# Patient Record
Sex: Female | Born: 1960 | Hispanic: Yes | Marital: Married | Smoking: Never smoker
Health system: Southern US, Community
[De-identification: ages and names within clinical notes are randomized; demographics above are authoritative.]

## PROBLEM LIST (undated history)

## (undated) DIAGNOSIS — Z87898 Personal history of other specified conditions: Secondary | ICD-10-CM

## (undated) DIAGNOSIS — E785 Hyperlipidemia, unspecified: Secondary | ICD-10-CM

## (undated) DIAGNOSIS — S82899A Other fracture of unspecified lower leg, initial encounter for closed fracture: Secondary | ICD-10-CM

## (undated) DIAGNOSIS — E039 Hypothyroidism, unspecified: Secondary | ICD-10-CM

## (undated) DIAGNOSIS — J40 Bronchitis, not specified as acute or chronic: Secondary | ICD-10-CM

## (undated) DIAGNOSIS — F419 Anxiety disorder, unspecified: Secondary | ICD-10-CM

## (undated) HISTORY — DX: Hypothyroidism, unspecified: E03.9

## (undated) HISTORY — DX: Hyperlipidemia, unspecified: E78.5

## (undated) HISTORY — DX: Personal history of other specified conditions: Z87.898

## (undated) HISTORY — PX: MENISCUS REPAIR: SHX5179

## (undated) HISTORY — PX: TUBAL LIGATION: SHX77

---

## 1998-01-30 HISTORY — PX: CHOLECYSTECTOMY: SHX55

## 2010-05-12 ENCOUNTER — Other Ambulatory Visit (HOSPITAL_COMMUNITY): Payer: Self-pay | Admitting: Family Medicine

## 2010-05-12 DIAGNOSIS — Z139 Encounter for screening, unspecified: Secondary | ICD-10-CM

## 2010-05-19 ENCOUNTER — Ambulatory Visit (HOSPITAL_COMMUNITY)
Admission: RE | Admit: 2010-05-19 | Discharge: 2010-05-19 | Disposition: A | Discharge status: Home / Self Care | Payer: PRIVATE HEALTH INSURANCE | Source: Ambulatory Visit | Attending: Family Medicine | Admitting: Family Medicine

## 2010-05-19 DIAGNOSIS — Z139 Encounter for screening, unspecified: Secondary | ICD-10-CM

## 2011-09-01 ENCOUNTER — Other Ambulatory Visit (HOSPITAL_COMMUNITY): Payer: Self-pay | Admitting: Nurse Practitioner

## 2011-09-01 DIAGNOSIS — Z139 Encounter for screening, unspecified: Secondary | ICD-10-CM

## 2011-09-07 ENCOUNTER — Ambulatory Visit (HOSPITAL_COMMUNITY)
Admission: RE | Admit: 2011-09-07 | Discharge: 2011-09-07 | Disposition: A | Discharge status: Home / Self Care | Payer: Self-pay | Source: Ambulatory Visit | Attending: Nurse Practitioner | Admitting: Nurse Practitioner

## 2011-09-07 DIAGNOSIS — Z139 Encounter for screening, unspecified: Secondary | ICD-10-CM

## 2012-12-17 ENCOUNTER — Other Ambulatory Visit (HOSPITAL_COMMUNITY): Payer: Self-pay | Admitting: Physician Assistant

## 2012-12-17 DIAGNOSIS — Z139 Encounter for screening, unspecified: Secondary | ICD-10-CM

## 2013-01-03 ENCOUNTER — Ambulatory Visit (HOSPITAL_COMMUNITY)
Admission: RE | Admit: 2013-01-03 | Discharge: 2013-01-03 | Disposition: A | Discharge status: Home / Self Care | Payer: PRIVATE HEALTH INSURANCE | Source: Ambulatory Visit | Attending: Physician Assistant | Admitting: Physician Assistant

## 2013-01-03 ENCOUNTER — Other Ambulatory Visit (HOSPITAL_COMMUNITY): Payer: Self-pay | Admitting: Physician Assistant

## 2013-01-03 ENCOUNTER — Ambulatory Visit (HOSPITAL_COMMUNITY)
Admission: RE | Admit: 2013-01-03 | Discharge: 2013-01-03 | Disposition: A | Discharge status: Home / Self Care | Payer: Self-pay | Source: Ambulatory Visit | Attending: Physician Assistant | Admitting: Physician Assistant

## 2013-01-03 DIAGNOSIS — Z139 Encounter for screening, unspecified: Secondary | ICD-10-CM

## 2014-01-30 DIAGNOSIS — S82899A Other fracture of unspecified lower leg, initial encounter for closed fracture: Secondary | ICD-10-CM

## 2014-01-30 HISTORY — DX: Other fracture of unspecified lower leg, initial encounter for closed fracture: S82.899A

## 2014-02-17 ENCOUNTER — Ambulatory Visit (HOSPITAL_COMMUNITY)
Admission: RE | Admit: 2014-02-17 | Discharge: 2014-02-17 | Disposition: A | Discharge status: Home / Self Care | Payer: Self-pay | Source: Ambulatory Visit | Attending: Physician Assistant | Admitting: Physician Assistant

## 2014-02-17 ENCOUNTER — Other Ambulatory Visit (HOSPITAL_COMMUNITY): Payer: Self-pay | Admitting: Physician Assistant

## 2014-02-17 DIAGNOSIS — R609 Edema, unspecified: Secondary | ICD-10-CM | POA: Insufficient documentation

## 2014-02-17 DIAGNOSIS — R52 Pain, unspecified: Secondary | ICD-10-CM

## 2014-02-17 DIAGNOSIS — M25571 Pain in right ankle and joints of right foot: Secondary | ICD-10-CM | POA: Insufficient documentation

## 2014-03-04 ENCOUNTER — Ambulatory Visit: Payer: Self-pay | Admitting: Sports Medicine

## 2014-08-06 ENCOUNTER — Other Ambulatory Visit (HOSPITAL_COMMUNITY): Payer: Self-pay | Admitting: *Deleted

## 2014-08-06 DIAGNOSIS — Z1231 Encounter for screening mammogram for malignant neoplasm of breast: Secondary | ICD-10-CM

## 2014-08-19 ENCOUNTER — Ambulatory Visit (HOSPITAL_COMMUNITY)
Admission: RE | Admit: 2014-08-19 | Discharge: 2014-08-19 | Disposition: A | Discharge status: Home / Self Care | Payer: PRIVATE HEALTH INSURANCE | Source: Ambulatory Visit | Attending: *Deleted | Admitting: *Deleted

## 2014-08-19 DIAGNOSIS — Z1231 Encounter for screening mammogram for malignant neoplasm of breast: Secondary | ICD-10-CM | POA: Diagnosis not present

## 2015-04-01 ENCOUNTER — Other Ambulatory Visit (HOSPITAL_COMMUNITY): Payer: Self-pay | Admitting: *Deleted

## 2015-04-01 DIAGNOSIS — R109 Unspecified abdominal pain: Secondary | ICD-10-CM

## 2015-04-05 ENCOUNTER — Ambulatory Visit (HOSPITAL_COMMUNITY)
Admission: RE | Admit: 2015-04-05 | Discharge: 2015-04-05 | Disposition: A | Discharge status: Home / Self Care | Payer: Self-pay | Source: Ambulatory Visit | Attending: *Deleted | Admitting: *Deleted

## 2015-04-12 ENCOUNTER — Ambulatory Visit (HOSPITAL_COMMUNITY)
Admission: RE | Admit: 2015-04-12 | Discharge: 2015-04-12 | Disposition: A | Discharge status: Home / Self Care | Payer: Self-pay | Source: Ambulatory Visit | Attending: *Deleted | Admitting: *Deleted

## 2015-04-12 ENCOUNTER — Ambulatory Visit (HOSPITAL_COMMUNITY): Admission: RE | Admit: 2015-04-12 | Payer: Self-pay | Source: Ambulatory Visit

## 2015-04-12 DIAGNOSIS — R109 Unspecified abdominal pain: Secondary | ICD-10-CM | POA: Insufficient documentation

## 2015-04-12 DIAGNOSIS — R93421 Abnormal radiologic findings on diagnostic imaging of right kidney: Secondary | ICD-10-CM | POA: Insufficient documentation

## 2015-04-12 DIAGNOSIS — R932 Abnormal findings on diagnostic imaging of liver and biliary tract: Secondary | ICD-10-CM | POA: Insufficient documentation

## 2015-04-12 DIAGNOSIS — R93422 Abnormal radiologic findings on diagnostic imaging of left kidney: Secondary | ICD-10-CM | POA: Insufficient documentation

## 2015-07-15 ENCOUNTER — Encounter: Payer: Self-pay | Admitting: Internal Medicine

## 2015-08-20 ENCOUNTER — Other Ambulatory Visit: Payer: Self-pay

## 2015-08-20 ENCOUNTER — Encounter (INDEPENDENT_AMBULATORY_CARE_PROVIDER_SITE_OTHER): Payer: Self-pay

## 2015-08-20 ENCOUNTER — Ambulatory Visit (INDEPENDENT_AMBULATORY_CARE_PROVIDER_SITE_OTHER): Payer: PRIVATE HEALTH INSURANCE | Admitting: Gastroenterology

## 2015-08-20 ENCOUNTER — Encounter: Payer: Self-pay | Admitting: Gastroenterology

## 2015-08-20 VITALS — BP 114/78 | HR 58 | Temp 97.1°F | Ht 62.0 in | Wt 223.0 lb

## 2015-08-20 DIAGNOSIS — R197 Diarrhea, unspecified: Secondary | ICD-10-CM

## 2015-08-20 DIAGNOSIS — K219 Gastro-esophageal reflux disease without esophagitis: Secondary | ICD-10-CM

## 2015-08-20 DIAGNOSIS — R1013 Epigastric pain: Secondary | ICD-10-CM

## 2015-08-20 DIAGNOSIS — K760 Fatty (change of) liver, not elsewhere classified: Secondary | ICD-10-CM

## 2015-08-20 DIAGNOSIS — K439 Ventral hernia without obstruction or gangrene: Secondary | ICD-10-CM

## 2015-08-20 DIAGNOSIS — K529 Noninfective gastroenteritis and colitis, unspecified: Secondary | ICD-10-CM

## 2015-08-20 MED ORDER — OMEPRAZOLE 20 MG PO CPDR: 20.0000 mg | DELAYED_RELEASE_CAPSULE | Freq: Every day | ORAL | Status: DC

## 2015-08-20 NOTE — Progress Notes (Signed)
Primary Care Physician:  Tylene FantasiaMUSE,ROCHELLE D., PA-C  Primary Gastroenterologist:  Roetta SessionsMichael Rourk, MD   Chief Complaint  Patient presents with  . Abdominal Pain    x 1 year    HPI:  Ana Coleman is a 55 y.o. Hispanic female, here at the request of Rochelle MUSE, PA-C at the health department for further evaluation of abdominal pain.  Patient presents with her daughter as well as formal interpreter.  Patient reports having gallbladder surgery around 2006. She states when they went in to take her gallbladder out they found a tumor. Patient and her daughter are not sure but believes it to be benign. Over the past couple of years she has been concerned about a "enlarging growth" in the epigastric region. Complains of intermittent epigastric pain for one year. Hard knot in the epigastric area, worse at night. At night pain seems to move at sides. She notes it when she's getting dressed at night. Some nausea. About two weeks ago, vomiting, but believes she drank some bad milk. Some heartburn for several weeks. Not every day. No dysphagia. BM diarrhea for four months. Before that constipation. Up to 7 times per day. Some nocutrnal no formed stool. brbpr few months ago once with wiping. No melena. No weight loss. 60 pounds weight gain in one year.  Daughter also voices concerns about several episodes where her mother developed severe upper abdominal pain, becomes diaphoretic, feels like she's going to pass out. Patient reports generally she has a bowel movement with these episodes.   Current Outpatient Prescriptions  Medication Sig Dispense Refill  . citalopram (CELEXA) 20 MG tablet Take 20 mg by mouth daily.    . simvastatin (ZOCOR) 20 MG tablet Take 20 mg by mouth daily.     No current facility-administered medications for this visit.    Allergies as of 08/20/2015  . (No Known Allergies)    Past Medical History  Diagnosis Date  . Hyperlipidemia     Past Surgical History   Procedure Laterality Date  . Cholecystectomy  2000    found a tumor at the same time  . Tubal ligation      Family History  Problem Relation Age of Onset  . Colon cancer Neg Hx   . Liver disease Neg Hx   . Pancreatic cancer Maternal Grandmother     Social History   Social History  . Marital Status: Married    Spouse Name: N/A  . Number of Children: 3  . Years of Education: N/A   Occupational History  . Not on file.   Social History Main Topics  . Smoking status: Never Smoker   . Smokeless tobacco: Not on file  . Alcohol Use: No  . Drug Use: No  . Sexual Activity: Not on file   Other Topics Concern  . Not on file   Social History Narrative  . No narrative on file      ROS:  General: Negative for anorexia, weight loss, fever, chills, fatigue, weakness. Eyes: Negative for vision changes.  ENT: Negative for hoarseness, difficulty swallowing , nasal congestion. CV: Negative for chest pain, angina, palpitations, dyspnea on exertion, peripheral edema.  Respiratory: Negative for dyspnea at rest, dyspnea on exertion, cough, sputum, wheezing.  GI: See history of present illness. GU:  Negative for dysuria, hematuria, urinary incontinence, urinary frequency, nocturnal urination.  MS: Negative for joint pain, low back pain.  Derm: Negative for rash or itching.  Neuro: Negative for weakness, abnormal sensation, seizure, frequent headaches,  memory loss, confusion.  Psych: Negative for anxiety, depression, suicidal ideation, hallucinations.  Endo: See history of present illness  Heme: Negative for bruising or bleeding. Allergy: Negative for rash or hives.    Physical Examination:  BP 114/78 mmHg  Pulse 58  Temp(Src) 97.1 F (36.2 C) (Oral)  Ht 5\' 2"  (1.575 m)  Wt 223 lb (101.152 kg)  BMI 40.78 kg/m2   General: Well-nourished, well-developed in no acute distress. Accompanied by daughter and interpreter Head: Normocephalic, atraumatic.   Eyes: Conjunctiva pink, no  icterus. Mouth: Oropharyngeal mucosa moist and pink , no lesions erythema or exudate. Neck: Supple without thyromegaly, masses, or lymphadenopathy.  Lungs: Clear to auscultation bilaterally.  Heart: Regular rate and rhythm, no murmurs rubs or gallops.  Abdomen: Bowel sounds are normal, mild epigastric tenderness at location of baseball-sized hernia. Easily reducible and nontender. nondistended, no hepatosplenomegaly or masses, no abdominal bruits , no rebound or guarding.   Rectal: Not performed Extremities: No lower extremity edema. No clubbing or deformities.  Neuro: Alert and oriented x 4 , grossly normal neurologically.  Skin: Warm and dry, no rash or jaundice.   Psych: Alert and cooperative, normal mood and affect.  Labs: February 2017 outside labs Hemoglobin 15.9, hematocrit 45.5, platelets 265,000, MCV 89.9, white blood cell count 10,600, BUN 11, creatinine 0.6, total bilirubin 0.6, alkaline phosphatase 82, AST 15, ALT 20, albumin 4.1, lipase 28  Imaging Studies:   CLINICAL DATA: 55 year old presenting with six-month history of intermittent mid epigastric abdominal pain associated with nausea. Personal history of cholecystectomy.  EXAM: ABDOMEN ULTRASOUND COMPLETE  COMPARISON: None.  FINDINGS: Gallbladder: Surgically absent.  Common bile duct: Diameter: Approximately 7 mm, normal after cholecystectomy.  Liver: Diffusely increased and coarsened echotexture without focal hepatic parenchymal abnormality. Patent portal vein with hepatopetal flow.  IVC: No abnormality visualized.  Pancreas: Normal appearing body. Head and tail obscured by midline bowel gas.  Spleen: Normal size and echotexture without focal parenchymal abnormality.  Right Kidney: Length: Approximately 12.6 cm. Mildly dilated calices without renal pelvis dilation. Borderline to mild diffuse cortical thinning. Normal parenchymal echotexture. No focal parenchymal abnormality. No visible  shadowing calculi.  Left Kidney: Length: Approximately 12.4 cm. Mildly dilated calices without renal pelvis dilation. Borderline to mild diffuse cortical thinning. Normal parenchymal echotexture. No focal parenchymal abnormality. No visible shadowing calculi.  Abdominal aorta: Normal in caliber throughout its visualized course in the abdomen without evidence of significant atherosclerosis. Maximum diameter 1.9 cm. The proximal aorta was obscured by bowel gas.  Other findings: None.  IMPRESSION: 1. Mildly dilated calices involving both kidneys without associated dilation of the renal pelvis. Query infundibular stenoses and/or calyceal diverticula as the cause of this appearance. 2. Borderline to mild diffuse cortical thinning involving both kidneys without focal renal parenchymal abnormality. 3. Diffuse hepatic steatosis and/or hepatocellular disease without focal parenchymal abnormality. 4. Surgically absent gallbladder. 5. Pancreatic head and tail and proximal abdominal aorta obscured by overlying bowel gas and therefore not evaluated.   Electronically Signed  By: Hulan Saashomas Lawrence M.D.  On: 04/12/2015 15:06  Impression/plan: 55 year old Hispanic female who presents with concerns for epigastric pain associated with enlarging growth. She has remote history of cholecystectomy with removal of a tumor approximately 17 years ago while still in [place]. On exam she has baseball sized abdominal hernia in the epigastric area. Easily reducible and nontender. She has artery been wearing an extended bra which provides support over this region. It is still not clear to me how much of her epigastric pain is  related to the hernia. In addition she has typical GERD symptoms. We have started her on Prilosec once daily, samples provided. She also complains of several month history of diarrhea. We will send off stool studies to the lab. She has never had a colonoscopy. Plan for colonoscopy plus  or minus upper endoscopy in the near future. I have discussed the risks, alternatives, benefits with regards to but not limited to the risk of reaction to medication, bleeding, infection, perforation and the patient is agreeable to proceed. Written consent to be obtained.  Once her procedures are completed, would send her for surgical consultation regarding hernia repair.  Patient also has fatty liver, LFTs are normal. Discussed appropriate measures to take with patient.

## 2015-08-20 NOTE — Patient Instructions (Addendum)
1. Start Prilosec once daily before breakfast for heartburn. 2. Please send stool to lab. 3. Colonoscopy and possible upper endoscopy. See separate instructions. 4. Once your procedure is done, we will send you to surgeon for the hernia. 5. You have some fatty liver on ultrasound likely related to genetics and being over weight. See directions below.   Instructions for fatty liver: Recommend 1-2# weight loss per week until ideal body weight through exercise & diet. Low fat/cholesterol diet.   Avoid sweets, sodas, fruit juices, sweetened beverages like tea, etc. Gradually increase exercise from 15 min daily up to 1 hr per day 5 days/week. Limit alcohol use.

## 2015-08-23 NOTE — Progress Notes (Signed)
cc'ed to pcp °

## 2015-08-26 ENCOUNTER — Encounter: Payer: Self-pay | Admitting: Internal Medicine

## 2015-09-09 ENCOUNTER — Telehealth: Payer: Self-pay

## 2015-09-09 NOTE — Telephone Encounter (Signed)
Pt's daughter Ana Coleman called to let us know that she has not been able to do the stool sample for the Gastrointestinal Pathogen Panel. She is wanting to know if she is ok to still do the TCS on 09/15/15. Please advise

## 2015-09-09 NOTE — Telephone Encounter (Signed)
Daughter is aware 

## 2015-09-09 NOTE — Telephone Encounter (Signed)
OK to proceed with TCS/EGD as planned.

## 2015-09-15 ENCOUNTER — Ambulatory Visit (HOSPITAL_COMMUNITY)
Admission: RE | Admit: 2015-09-15 | Discharge: 2015-09-15 | Disposition: A | Discharge status: Home / Self Care | Payer: Self-pay | Source: Ambulatory Visit | Attending: Internal Medicine | Admitting: Internal Medicine

## 2015-09-15 ENCOUNTER — Encounter (HOSPITAL_COMMUNITY)
Admission: RE | Disposition: A | Discharge status: Home / Self Care | Payer: Self-pay | Source: Ambulatory Visit | Attending: Internal Medicine

## 2015-09-15 ENCOUNTER — Encounter (HOSPITAL_COMMUNITY): Payer: Self-pay | Admitting: *Deleted

## 2015-09-15 DIAGNOSIS — Z9049 Acquired absence of other specified parts of digestive tract: Secondary | ICD-10-CM | POA: Insufficient documentation

## 2015-09-15 DIAGNOSIS — K3189 Other diseases of stomach and duodenum: Secondary | ICD-10-CM

## 2015-09-15 DIAGNOSIS — R110 Nausea: Secondary | ICD-10-CM | POA: Insufficient documentation

## 2015-09-15 DIAGNOSIS — Z79899 Other long term (current) drug therapy: Secondary | ICD-10-CM | POA: Insufficient documentation

## 2015-09-15 DIAGNOSIS — K219 Gastro-esophageal reflux disease without esophagitis: Secondary | ICD-10-CM

## 2015-09-15 DIAGNOSIS — E785 Hyperlipidemia, unspecified: Secondary | ICD-10-CM | POA: Insufficient documentation

## 2015-09-15 DIAGNOSIS — R1013 Epigastric pain: Secondary | ICD-10-CM

## 2015-09-15 DIAGNOSIS — R197 Diarrhea, unspecified: Secondary | ICD-10-CM

## 2015-09-15 DIAGNOSIS — K469 Unspecified abdominal hernia without obstruction or gangrene: Secondary | ICD-10-CM | POA: Insufficient documentation

## 2015-09-15 DIAGNOSIS — K529 Noninfective gastroenteritis and colitis, unspecified: Secondary | ICD-10-CM

## 2015-09-15 DIAGNOSIS — K760 Fatty (change of) liver, not elsewhere classified: Secondary | ICD-10-CM | POA: Insufficient documentation

## 2015-09-15 DIAGNOSIS — K5730 Diverticulosis of large intestine without perforation or abscess without bleeding: Secondary | ICD-10-CM

## 2015-09-15 DIAGNOSIS — K2940 Chronic atrophic gastritis without bleeding: Secondary | ICD-10-CM | POA: Insufficient documentation

## 2015-09-15 HISTORY — PX: ESOPHAGOGASTRODUODENOSCOPY: SHX5428

## 2015-09-15 HISTORY — PX: BIOPSY: SHX5522

## 2015-09-15 HISTORY — PX: COLONOSCOPY: SHX5424

## 2015-09-15 HISTORY — DX: Bronchitis, not specified as acute or chronic: J40

## 2015-09-15 LAB — C DIFFICILE QUICK SCREEN W PCR REFLEX
C DIFFICILE (CDIFF) INTERP: NOT DETECTED
C DIFFICILE (CDIFF) TOXIN: NEGATIVE
C Diff antigen: NEGATIVE

## 2015-09-15 LAB — GASTROINTESTINAL PANEL BY PCR, STOOL (REPLACES STOOL CULTURE)
ASTROVIRUS: NOT DETECTED
Adenovirus F40/41: NOT DETECTED
Campylobacter species: NOT DETECTED
Cryptosporidium: NOT DETECTED
Cyclospora cayetanensis: NOT DETECTED
E. COLI O157: NOT DETECTED
ENTEROTOXIGENIC E COLI (ETEC): NOT DETECTED
Entamoeba histolytica: NOT DETECTED
Enteroaggregative E coli (EAEC): NOT DETECTED
Enteropathogenic E coli (EPEC): NOT DETECTED
Giardia lamblia: NOT DETECTED
NOROVIRUS GI/GII: NOT DETECTED
Plesimonas shigelloides: NOT DETECTED
ROTAVIRUS A: NOT DETECTED
SAPOVIRUS (I, II, IV, AND V): NOT DETECTED
SHIGA LIKE TOXIN PRODUCING E COLI (STEC): NOT DETECTED
Salmonella species: NOT DETECTED
Shigella/Enteroinvasive E coli (EIEC): NOT DETECTED
Vibrio cholerae: NOT DETECTED
Vibrio species: NOT DETECTED
Yersinia enterocolitica: NOT DETECTED

## 2015-09-15 SURGERY — COLONOSCOPY
Anesthesia: Moderate Sedation

## 2015-09-15 MED ORDER — MIDAZOLAM HCL 5 MG/5ML IJ SOLN
INTRAMUSCULAR | Status: AC
  Filled 2015-09-15: qty 10

## 2015-09-15 MED ORDER — LIDOCAINE VISCOUS 2 % MT SOLN
OROMUCOSAL | Status: DC | PRN
  Administered 2015-09-15: 3 mL via OROMUCOSAL

## 2015-09-15 MED ORDER — LIDOCAINE VISCOUS 2 % MT SOLN
OROMUCOSAL | Status: AC
  Filled 2015-09-15: qty 15

## 2015-09-15 MED ORDER — SODIUM CHLORIDE 0.9 % IV SOLN
INTRAVENOUS | Status: DC
  Administered 2015-09-15: 1000 mL via INTRAVENOUS

## 2015-09-15 MED ORDER — MEPERIDINE HCL 100 MG/ML IJ SOLN
INTRAMUSCULAR | Status: DC
Start: 2015-09-15 — End: 2015-09-15
  Filled 2015-09-15: qty 2

## 2015-09-15 MED ORDER — STERILE WATER FOR IRRIGATION IR SOLN
Status: DC | PRN
  Administered 2015-09-15: 08:00:00

## 2015-09-15 MED ORDER — ONDANSETRON HCL 4 MG/2ML IJ SOLN
INTRAMUSCULAR | Status: AC
  Filled 2015-09-15: qty 2

## 2015-09-15 MED ORDER — ONDANSETRON HCL 4 MG/2ML IJ SOLN
INTRAMUSCULAR | Status: DC | PRN
  Administered 2015-09-15: 4 mg via INTRAVENOUS

## 2015-09-15 MED ORDER — MEPERIDINE HCL 100 MG/ML IJ SOLN
INTRAMUSCULAR | Status: DC | PRN
  Administered 2015-09-15: 25 mg via INTRAVENOUS
  Administered 2015-09-15: 50 mg via INTRAVENOUS

## 2015-09-15 MED ORDER — MIDAZOLAM HCL 5 MG/5ML IJ SOLN
INTRAMUSCULAR | Status: DC | PRN
  Administered 2015-09-15 (×2): 1 mg via INTRAVENOUS
  Administered 2015-09-15: 2 mg via INTRAVENOUS
  Administered 2015-09-15: 1 mg via INTRAVENOUS

## 2015-09-15 NOTE — Discharge Instructions (Addendum)
Colonoscopa: cuidados posteriores (Colonoscopy, Care After) Siga estas instrucciones durante las prximas semanas. Estas indicaciones le proporcionan informacin general acerca de cmo deber cuidarse despus del procedimiento. El mdico tambin podr darle instrucciones ms especficas. El tratamiento ha sido planificado segn las prcticas mdicas actuales, pero en algunos casos pueden ocurrir problemas. Comunquese con el mdico si tiene algn problema o tiene dudas despus del procedimiento. QU ESPERAR DESPUS DEL PROCEDIMIENTO  Despus del procedimiento, es comn tener las siguientes sensaciones:  Una pequea cantidad de sangre en la materia fecal.  Cantidades moderadas de gases e hinchazn o calambres abdominales leves. INSTRUCCIONES PARA EL CUIDADO EN EL HOGAR  No conduzca vehculos, opere maquinarias ni firme documentos importantes durante 24horas.  Puede ducharse y retomar sus actividades fsicas habituales, pero muvase a un ritmo ms lento durante las primeras 24horas.  Tmese descansos frecuentes durante las primeras 24horas.  Camine o colquese compresas calientes en el abdomen para ayudar a reducir los calambres e hinchazn abdominales.  Beba suficiente lquido para Photographer orina clara o de color amarillo plido.  Puede retomar su dieta normal segn las instrucciones de su mdico. Evite los alimentos pesados o fritos que son difciles de Location manager.  Evite consumir alcohol durante 24horas o segn las instrucciones de su mdico.  Tome solo medicamentos de venta libre o recetados, segn las indicaciones del mdico.  Si se obtuvo una muestra de tejido (biopsia) durante el procedimiento:  No tome aspirina ni anticoagulantes durante 7das, o segn las instrucciones de su mdico.  No consuma alcohol durante 7das o segn las instrucciones de su mdico.  Consuma alimentos livianos durante las primeras 24horas. SOLICITE ATENCIN MDICA SI: Tiene manchas persistentes  de sangre en la materia fecal entre 2 y 3das posteriores al procedimiento. SOLICITE ATENCIN MDICA DE INMEDIATO SI:  Tiene ms que una pequea mancha de sangre en la materia fecal.  Elimina grandes cogulos de sangre en la materia fecal.  Tiene el abdomen hinchado (distendido).  Tiene nuseas o vmitos.  Tiene fiebre.  Siente dolor intenso en el abdomen que no se alivia con los [employer].   Esta informacin no tiene Theme park manager el consejo del mdico. Asegrese de hacerle al mdico cualquier pregunta que tenga.   Document Released: 04/14/2008 Document Revised: 11/06/2012 Elsevier Interactive Patient Education 2016 [employer].  Esofagogastroduodenoscopia: cuidados posteriores (Esophagogastroduodenoscopy, Care After) Siga estas instrucciones durante las prximas semanas. Estas indicaciones le proporcionan informacin acerca de cmo deber cuidarse despus del procedimiento. El mdico tambin podr darle instrucciones ms especficas. El tratamiento ha sido planificado segn las prcticas mdicas actuales, pero en algunos casos pueden ocurrir problemas. Comunquese con el mdico si tiene algn problema o tiene dudas despus del procedimiento.  QU ESPERAR DESPUS DEL PROCEDIMIENTO  Despus del procedimiento, es tpico tener las siguientes sensaciones:   Dance movement psychotherapist.  Dolor al tragar.  Ganas de vomitar (nuseas).  Hinchazn.  Mareos.  Cansancio. INSTRUCCIONES PARA EL CUIDADO EN EL HOGAR  No coma ni beba nada hasta que el efecto del anestsico (anestesia local) haya desaparecido y vuelva a tener reflejo farngeo. Si puede tragar con comodidad, significa que el efecto de la anestesia local ha desaparecido.  No conduzca ni opere maquinarias hasta que el mdico lo autorice.  Tome los medicamentos solamente como se lo haya indicado el mdico. SOLICITE ATENCIN MDICA SI:   No puede dejar de toser.  No orina u orina menos de lo habitual. SOLICITE  ATENCIN MDICA DE INMEDIATO SI:  Tiene dificultad para tragar.  No puede  comer o beber.  El dolor de garganta o pecho empeora.  Est mareado, tiene una sensacin de desvanecimiento o se desmaya.  Tiene nuseas o vmitos.  Tiene escalofros.  Tiene fiebre.  Siente un dolor abdominal intenso.  La materia fecal es negra, de aspecto alquitranado o sanguinolenta.   Esta informacin no tiene Theme park manager el consejo del mdico. Asegrese de hacerle al mdico cualquier pregunta que tenga.   Document Released: 05/13/2012 Document Revised: 02/06/2014 Elsevier Interactive Patient Education 2016 Elsevier Inc.  Hernia ventral  (Ventral Hernia) La hernia ventral (tambin llamada hernia incisional) aparece en el sitio de una herida quirrgica previa (incisin) en el abdomen. La pared abdominal se extiende desde la parte baja del trax hasta la pelvis. Si la pared abdominal se debilita por una incisin quirrgica, puede formarse una hernia. La hernia es una protuberancia del intestino o de tejido muscular que empuja hacia fuera en la parte debilitada de la pared abdominal. La hernia ventral puede agrandarse por un esfuerzo o por levantar objetos pesados.  Las Financial planner y obesas tienen un mayor riesgo de sufrir una hernia ventral. Tambin tienen un mayor riesgo las personas que desarrollan infecciones despus de la ciruga o que requieren repetidas incisiones en el mismo sitio del abdomen.  CAUSAS  La hernia ventral se produce debido a la debilidad de la pared abdominal, en el sitio de una incisin.  SNTOMAS  Los sntomas ms comunes son:   Un bulto visible o protuberancia en la pared abdominal.  Dolor o sensibilidad alrededor del bulto.  Aumento de las molestias al toser o al hacer un movimiento repentino. Si la hernia obstruye una parte del intestino, puede haber complicaciones graves (hernia encarcelada o estrangulada). Puede llegar a ser un problema que requiere [place] de  emergencia debido a que se corta el flujo de sangre al intestino bloqueado. Los sntomas pueden ser:   Caleen [place] de vomitar (nuseas).  Vmitos.  Hinchazn en el estmago (distensin) o inflamacin.  Grant Ruts.  Frecuencia cardaca rpida. DIAGNSTICO  El mdico le har una historia clnica y le har un examen fsico. Le indicar varios estudios, como:   Anlisis de [place].  Pruebas de [place].  Ecografas.  Radiografas.  Tomografa computada (TC). TRATAMIENTO  Una observacin atenta puede ser todo lo que se necesite para una hernia pequea que no causa sntomas. Su mdico puede recomendar el uso de un cinturn de soporte (braguero) para Electrical engineer la pared abdominal. Para las hernias ms grandes o dolorosas, se recomienda una ciruga para reparar la hernia. Si la hernia se estrangula, ser necesaria [place].  INSTRUCCIONES PARA EL CUIDADO EN EL HOGAR   Evite ejercer presin o tensin sobre el rea abdominal.  Evite levantar objetos pesados.  Use una buena postura del cuerpo para realizar tareas fsicas. Consulte a su mdico acerca de las posturas apropiadas.  Use un cinturn de sostn segn las indicaciones de su mdico.  Mantenga un peso saludable.  Consuma alimentos ricos en fibra, como cereales integrales, frutas y vegetales. La fibra ayuda en el caso de dificultad para mover el intestino (constipacin).  Debe ingerir gran cantidad de lquido para mantener la orina de tono claro o color amarillo plido.  Concurra a las visitas de control, segn las indicaciones. SOLICITE ATENCIN MDICA SI:   La hernia es ms grande o ms dolorosa. SOLICITE ATENCIN MDICA DE INMEDIATO SI:   Siente dolor abdominal agudo y de inicio sbito.  El dolor se hace ms intenso.  Tiene vmitos persistentes.  Tiene sudoracin abundante.  Nota que la frecuencia cardaca est acelerada.  Tiene fiebre. ASEGRESE DE QUE:   Comprende estas instrucciones.  Controlar su  enfermedad.  Solicitar ayuda de inmediato si no mejora o si empeora.   Esta informacin no tiene Theme park managercomo fin reemplazar el consejo del mdico. Asegrese de hacerle al mdico cualquier pregunta que tenga.   Document Released: 05/13/2012 Elsevier Interactive Patient Education [employer]2016 Elsevier Inc.   Colonoscopy Discharge Instructions  Read the instructions outlined below and refer to this sheet in the next few weeks. These discharge instructions provide you with general information on caring for yourself after you leave the hospital. Your doctor may also give you specific instructions. While your treatment has been planned according to the most current medical practices available, unavoidable complications occasionally occur. If you have any problems or questions after discharge, call Dr. Jena Gaussourk at [phone]. ACTIVITY  You may resume your regular activity, but move at a slower pace for the next 24 hours.   Take frequent rest periods for the next 24 hours.   Walking will help get rid of the air and reduce the bloated feeling in your belly (abdomen).   No driving for 24 hours (because of the medicine (anesthesia) used during the test).    Do not sign any important legal documents or operate any machinery for 24 hours (because of the anesthesia used during the test).  NUTRITION  Drink plenty of fluids.   You may resume your normal diet as instructed by your doctor.   Begin with a light meal and progress to your normal diet. Heavy or fried foods are harder to digest and may make you feel sick to your stomach (nauseated).   Avoid alcoholic beverages for 24 hours or as instructed.  MEDICATIONS  You may resume your normal medications unless your doctor tells you otherwise.  WHAT YOU CAN EXPECT TODAY  Some feelings of bloating in the abdomen.   Passage of more gas than usual.   Spotting of blood in your stool or on the toilet paper.  IF YOU HAD POLYPS REMOVED DURING THE COLONOSCOPY:  No  aspirin products for 7 days or as instructed.   No alcohol for 7 days or as instructed.   Eat a soft diet for the next 24 hours.  FINDING OUT THE RESULTS OF YOUR TEST Not all test results are available during your visit. If your test results are not back during the visit, make an appointment with your caregiver to find out the results. Do not assume everything is normal if you have not heard from your caregiver or the medical facility. It is important for you to follow up on all of your test results.  SEEK IMMEDIATE MEDICAL ATTENTION IF:  You have more than a spotting of blood in your stool.   Your belly is swollen (abdominal distention).   You are nauseated or vomiting.   You have a temperature over 101.   You have abdominal pain or discomfort that is severe or gets worse throughout the day.  EGD Discharge instructions Please read the instructions outlined below and refer to this sheet in the next few weeks. These discharge instructions provide you with general information on caring for yourself after you leave the hospital. Your doctor may also give you specific instructions. While your treatment has been planned according to the most current medical practices available, unavoidable complications occasionally occur. If you have any problems or questions after discharge, please call your doctor. ACTIVITY  You  may resume your regular activity but move at a slower pace for the next 24 hours.   Take frequent rest periods for the next 24 hours.   Walking will help expel (get rid of) the air and reduce the bloated feeling in your abdomen.   No driving for 24 hours (because of the anesthesia (medicine) used during the test).   You may shower.   Do not sign any important legal documents or operate any machinery for 24 hours (because of the anesthesia used during the test).  NUTRITION  Drink plenty of fluids.   You may resume your normal diet.   Begin with a light meal and progress  to your normal diet.   Avoid alcoholic beverages for 24 hours or as instructed by your caregiver.  MEDICATIONS  You may resume your normal medications unless your caregiver tells you otherwise.  WHAT YOU CAN EXPECT TODAY  You may experience abdominal discomfort such as a feeling of fullness or gas pains.  FOLLOW-UP  Your doctor will discuss the results of your test with you.  SEEK IMMEDIATE MEDICAL ATTENTION IF ANY OF THE FOLLOWING OCCUR:  Excessive nausea (feeling sick to your stomach) and/or vomiting.   Severe abdominal pain and distention (swelling).   Trouble swallowing.   Temperature over 101 F (37.8 C).   Rectal bleeding or vomiting of blood.    Diverticulosis information provided  Further recommendations to follow pending review of pathology/lab results  Ventral Hernia A ventral hernia (also called an incisional hernia) is a hernia that occurs at the site of a previous surgical cut (incision) in the abdomen. The abdominal wall spans from your lower chest down to your pelvis. If the abdominal wall is weakened from a surgical incision, a hernia can occur. A hernia is a bulge of bowel or muscle tissue pushing out on the weakened part of the abdominal wall. Ventral hernias can get bigger from straining or lifting. Obese and older people are at higher risk for a ventral hernia. People who develop infections after surgery or require repeat incisions at the same site on the abdomen are also at increased risk. CAUSES  A ventral hernia occurs because of weakness in the abdominal wall at an incision site.  SYMPTOMS  Common symptoms include:  A visible bulge or lump on the abdominal wall.  Pain or tenderness around the lump.  Increased discomfort if you cough or make a sudden movement. If the hernia has blocked part of the intestine, a serious complication can occur (incarcerated or strangulated hernia). This can become a problem that requires emergency surgery because the  blood flow to the blocked intestine may be cut off. Symptoms may include:  Feeling sick to your stomach (nauseous).  Throwing up (vomiting).  Stomach swelling (distention) or bloating.  Fever.  Rapid heartbeat. DIAGNOSIS  Your health care provider will take a medical history and perform a physical exam. Various tests may be ordered, such as:  Blood tests.  Urine tests.  Ultrasonography.  X-rays.  Computed tomography (CT). TREATMENT  Watchful waiting may be all that is needed for a smaller hernia that does not cause symptoms. Your health care provider may recommend the use of a supportive belt (truss) that helps to keep the abdominal wall intact. For larger hernias or those that cause pain, surgery to repair the hernia is usually recommended. If a hernia becomes strangulated, emergency surgery needs to be done right away. HOME CARE INSTRUCTIONS  Avoid putting pressure or strain on the abdominal  area.  Avoid heavy lifting.  Use good body positioning for physical tasks. Ask your health care provider about proper body positioning.  Use a supportive belt as directed by your health care provider.  Maintain a healthy weight.  Eat foods that are high in fiber, such as whole grains, fruits, and vegetables. Fiber helps prevent difficult bowel movements (constipation).  Drink enough fluids to keep your urine clear or pale yellow.  Follow up with your health care provider as directed. SEEK MEDICAL CARE IF:   Your hernia seems to be getting larger or more painful. SEEK IMMEDIATE MEDICAL CARE IF:   You have abdominal pain that is sudden and sharp.  Your pain becomes severe.  You have repeated vomiting.  You are sweating a lot.  You notice a rapid heartbeat.  You develop a fever. MAKE SURE YOU:   Understand these instructions.  Will watch your condition.  Will get help right away if you are not doing well or get worse.   This information is not intended to replace  advice given to you by your health care provider. Make sure you discuss any questions you have with your health care provider.   Document Released: 01/03/2012 Document Revised: 02/06/2014 Document Reviewed: 01/03/2012 Elsevier Interactive Patient Education [employer]2016 Elsevier Inc.

## 2015-09-15 NOTE — H&P (View-Only) (Signed)
Primary Care Physician:  Tylene FantasiaMUSE,ROCHELLE D., PA-C  Primary Gastroenterologist:  Roetta SessionsMichael Rourk, MD   Chief Complaint  Patient presents with  . Abdominal Pain    x 1 year    HPI:  Ana ShowsMaria Guadalupe Coleman is a 55 y.o. Hispanic female, here at the request of Rochelle MUSE, PA-C at the health department for further evaluation of abdominal pain.  Patient presents with her daughter as well as formal interpreter.  Patient reports having gallbladder surgery around 2006. She states when they went in to take her gallbladder out they found a tumor. Patient and her daughter are not sure but believes it to be benign. Over the past couple of years she has been concerned about a "enlarging growth" in the epigastric region. Complains of intermittent epigastric pain for one year. Hard knot in the epigastric area, worse at night. At night pain seems to move at sides. She notes it when she's getting dressed at night. Some nausea. About two weeks ago, vomiting, but believes she drank some bad milk. Some heartburn for several weeks. Not every day. No dysphagia. BM diarrhea for four months. Before that constipation. Up to 7 times per day. Some nocutrnal no formed stool. brbpr few months ago once with wiping. No melena. No weight loss. 60 pounds weight gain in one year.  Daughter also voices concerns about several episodes where her mother developed severe upper abdominal pain, becomes diaphoretic, feels like she's going to pass out. Patient reports generally she has a bowel movement with these episodes.   Current Outpatient Prescriptions  Medication Sig Dispense Refill  . citalopram (CELEXA) 20 MG tablet Take 20 mg by mouth daily.    . simvastatin (ZOCOR) 20 MG tablet Take 20 mg by mouth daily.     No current facility-administered medications for this visit.    Allergies as of 08/20/2015  . (No Known Allergies)    Past Medical History  Diagnosis Date  . Hyperlipidemia     Past Surgical History   Procedure Laterality Date  . Cholecystectomy  2000    found a tumor at the same time  . Tubal ligation      Family History  Problem Relation Age of Onset  . Colon cancer Neg Hx   . Liver disease Neg Hx   . Pancreatic cancer Maternal Grandmother     Social History   Social History  . Marital Status: Married    Spouse Name: N/A  . Number of Children: 3  . Years of Education: N/A   Occupational History  . Not on file.   Social History Main Topics  . Smoking status: Never Smoker   . Smokeless tobacco: Not on file  . Alcohol Use: No  . Drug Use: No  . Sexual Activity: Not on file   Other Topics Concern  . Not on file   Social History Narrative  . No narrative on file      ROS:  General: Negative for anorexia, weight loss, fever, chills, fatigue, weakness. Eyes: Negative for vision changes.  ENT: Negative for hoarseness, difficulty swallowing , nasal congestion. CV: Negative for chest pain, angina, palpitations, dyspnea on exertion, peripheral edema.  Respiratory: Negative for dyspnea at rest, dyspnea on exertion, cough, sputum, wheezing.  GI: See history of present illness. GU:  Negative for dysuria, hematuria, urinary incontinence, urinary frequency, nocturnal urination.  MS: Negative for joint pain, low back pain.  Derm: Negative for rash or itching.  Neuro: Negative for weakness, abnormal sensation, seizure, frequent headaches,  memory loss, confusion.  Psych: Negative for anxiety, depression, suicidal ideation, hallucinations.  Endo: See history of present illness  Heme: Negative for bruising or bleeding. Allergy: Negative for rash or hives.    Physical Examination:  BP 114/78 mmHg  Pulse 58  Temp(Src) 97.1 F (36.2 C) (Oral)  Ht 5\' 2"  (1.575 m)  Wt 223 lb (101.152 kg)  BMI 40.78 kg/m2   General: Well-nourished, well-developed in no acute distress. Accompanied by daughter and interpreter Head: Normocephalic, atraumatic.   Eyes: Conjunctiva pink, no  icterus. Mouth: Oropharyngeal mucosa moist and pink , no lesions erythema or exudate. Neck: Supple without thyromegaly, masses, or lymphadenopathy.  Lungs: Clear to auscultation bilaterally.  Heart: Regular rate and rhythm, no murmurs rubs or gallops.  Abdomen: Bowel sounds are normal, mild epigastric tenderness at location of baseball-sized hernia. Easily reducible and nontender. nondistended, no hepatosplenomegaly or masses, no abdominal bruits , no rebound or guarding.   Rectal: Not performed Extremities: No lower extremity edema. No clubbing or deformities.  Neuro: Alert and oriented x 4 , grossly normal neurologically.  Skin: Warm and dry, no rash or jaundice.   Psych: Alert and cooperative, normal mood and affect.  Labs: February 2017 outside labs Hemoglobin 15.9, hematocrit 45.5, platelets 265,000, MCV 89.9, white blood cell count 10,600, BUN 11, creatinine 0.6, total bilirubin 0.6, alkaline phosphatase 82, AST 15, ALT 20, albumin 4.1, lipase 28  Imaging Studies:   CLINICAL DATA: 55 year old presenting with six-month history of intermittent mid epigastric abdominal pain associated with nausea. Personal history of cholecystectomy.  EXAM: ABDOMEN ULTRASOUND COMPLETE  COMPARISON: None.  FINDINGS: Gallbladder: Surgically absent.  Common bile duct: Diameter: Approximately 7 mm, normal after cholecystectomy.  Liver: Diffusely increased and coarsened echotexture without focal hepatic parenchymal abnormality. Patent portal vein with hepatopetal flow.  IVC: No abnormality visualized.  Pancreas: Normal appearing body. Head and tail obscured by midline bowel gas.  Spleen: Normal size and echotexture without focal parenchymal abnormality.  Right Kidney: Length: Approximately 12.6 cm. Mildly dilated calices without renal pelvis dilation. Borderline to mild diffuse cortical thinning. Normal parenchymal echotexture. No focal parenchymal abnormality. No visible  shadowing calculi.  Left Kidney: Length: Approximately 12.4 cm. Mildly dilated calices without renal pelvis dilation. Borderline to mild diffuse cortical thinning. Normal parenchymal echotexture. No focal parenchymal abnormality. No visible shadowing calculi.  Abdominal aorta: Normal in caliber throughout its visualized course in the abdomen without evidence of significant atherosclerosis. Maximum diameter 1.9 cm. The proximal aorta was obscured by bowel gas.  Other findings: None.  IMPRESSION: 1. Mildly dilated calices involving both kidneys without associated dilation of the renal pelvis. Query infundibular stenoses and/or calyceal diverticula as the cause of this appearance. 2. Borderline to mild diffuse cortical thinning involving both kidneys without focal renal parenchymal abnormality. 3. Diffuse hepatic steatosis and/or hepatocellular disease without focal parenchymal abnormality. 4. Surgically absent gallbladder. 5. Pancreatic head and tail and proximal abdominal aorta obscured by overlying bowel gas and therefore not evaluated.   Electronically Signed  By: Hulan Saashomas Lawrence M.D.  On: 04/12/2015 15:06  Impression/plan: 55 year old Hispanic female who presents with concerns for epigastric pain associated with enlarging growth. She has remote history of cholecystectomy with removal of a tumor approximately 17 years ago while still in [place]. On exam she has baseball sized abdominal hernia in the epigastric area. Easily reducible and nontender. She has artery been wearing an extended bra which provides support over this region. It is still not clear to me how much of her epigastric pain is  related to the hernia. In addition she has typical GERD symptoms. We have started her on Prilosec once daily, samples provided. She also complains of several month history of diarrhea. We will send off stool studies to the lab. She has never had a colonoscopy. Plan for colonoscopy plus  or minus upper endoscopy in the near future. I have discussed the risks, alternatives, benefits with regards to but not limited to the risk of reaction to medication, bleeding, infection, perforation and the patient is agreeable to proceed. Written consent to be obtained.  Once her procedures are completed, would send her for surgical consultation regarding hernia repair.  Patient also has fatty liver, LFTs are normal. Discussed appropriate measures to take with patient.

## 2015-09-15 NOTE — Op Note (Signed)
[facility] Patient Name: Ana Coleman Procedure Date: 09/15/2015 7:57 AM MRN: [id number] Date of Birth: [birth year] Attending MD: Gennette Pacobert Michael Brentyn Seehafer , MD CSN: [id number] Age: [id number] Admit Type: Outpatient Procedure:                Ileo-colonoscopy ith biopsy and stool sampling Indications:              Chronic diarrhea Providers:                Gennette Pacobert Michael Keina Mutch, MD, Brain HiltsLurae Albert RN, RN,                            Birder Robsonebra Houghton, Technician Referring MD:              Medicines:                Midazolam 5 mg IV, Meperidine 75 mg IV, Ondansetron                            4 mg IV Complications:            No immediate complications. Estimated Blood Loss:     Estimated blood loss was minimal. Procedure:                Pre-Anesthesia Assessment:                           - Prior to the procedure, a History and Physical                            was performed, and patient medications and                            allergies were reviewed. The patient's tolerance of                            previous anesthesia was also reviewed. The risks                            and benefits of the procedure and the sedation                            options and risks were discussed with the patient.                            All questions were answered, and informed consent                            was obtained. Prior Anticoagulants: The patient has                            taken no previous anticoagulant or antiplatelet                            agents. ASA Grade Assessment: II - A patient with  mild systemic disease. After reviewing the risks                            and benefits, the patient was deemed in                            satisfactory condition to undergo the procedure.                           - Prior to the procedure, a History and Physical                            was performed, and patient medications and     allergies were reviewed. The patient's tolerance of                            previous anesthesia was also reviewed. The risks                            and benefits of the procedure and the sedation                            options and risks were discussed with the patient.                            All questions were answered, and informed consent                            was obtained. Prior Anticoagulants: The patient has                            taken no previous anticoagulant or antiplatelet                            agents. ASA Grade Assessment: II - A patient with                            mild systemic disease. After reviewing the risks                            and benefits, the patient was deemed in                            satisfactory condition to undergo the procedure.                           After obtaining informed consent, the colonoscope                            was passed under direct vision. Throughout the                            procedure, the patient's blood pressure, pulse, and  oxygen saturations were monitored continuously. The                            [id number] ([id number]) scope was introduced through                            the anus and advanced to the 5 cm into the ileum.                            The colonoscopy was performed without difficulty.                            The patient tolerated the procedure well. The                            quality of the bowel preparation was adequate. The                            terminal ileum, ileocecal valve, appendiceal                            orifice, and rectum were photographed. Scope In: 8:03:25 AM Scope Out: 8:14:56 AM Scope Withdrawal Time: 0 hours 6 minutes 20 seconds  Total Procedure Duration: 0 hours 11 minutes 31 seconds  Findings:      The perianal and digital rectal examinations were normal. Pertinent       negatives include normal sphincter tone.       A few diverticula were found in the entire colon. Biopsies were taken       with a cold forceps for histology. Estimated blood loss was minimal.      The exam was otherwise without abnormality on direct and retroflexion       views. Segmental biopsies of the ascending, descending/sigmoid segments       taken. Stool sample was also submitted for studies. Impression:               - Diverticulosis in the entire examined colon.                            Biopsied.                           - The examination was otherwise normal on direct                            and retroflexion views. Moderate Sedation:      Moderate (conscious) sedation was administered by the endoscopy nurse       and supervised by the endoscopist. The following parameters were       monitored: oxygen saturation, heart rate, blood pressure, and response       to care. Total physician intraservice time was 30 minutes. Recommendation:           - Patient has a contact number available for                            emergencies. The signs and  symptoms of potential                            delayed complications were discussed with the                            patient. Return to normal activities tomorrow.                            Written discharge instructions were provided to the                            patient.                           - Advance diet as tolerated.                           - Continue present medications.                           - Await pathology results/ stool studies.                           - No recommendation at this time regarding repeat                            colonoscopy.                           - Return to GI office after studies are complete. Procedure Code(s):        --- Professional ---                           [phone], Colonoscopy, flexible; with biopsy, single                            or multiple                           99152, Moderate sedation services provided by  the                            same physician or other qualified health care                            professional performing the diagnostic or                            therapeutic service that the sedation supports,                            requiring the presence of an independent trained                            observer to assist in the monitoring of the  patient's level of consciousness and physiological                            status; initial 15 minutes of intraservice time,                            patient age 29 years or older                           [phone], Moderate sedation services; each additional                            15 minutes intraservice time Diagnosis Code(s):        --- Professional ---                           K52.9, Noninfective gastroenteritis and colitis,                            unspecified                           K57.30, Diverticulosis of large intestine without                            perforation or abscess without bleeding CPT copyright 2016 American Medical Association. All rights reserved. The codes documented in this report are preliminary and upon coder review may  be revised to meet current compliance requirements. Gerrit Friends. Arla Boutwell, MD Gennette Pac, MD 09/15/2015 8:23:19 AM This report has been signed electronically. Number of Addenda: 0

## 2015-09-15 NOTE — Interval H&P Note (Signed)
History and Physical Interval Note:  09/15/2015 7:41 AM  Ana Coleman  has presented today for surgery, with the diagnosis of DIARRHEA/GERD/VENTRAL HERINA  The various methods of treatment have been discussed with the patient and family. After consideration of risks, benefits and other options for treatment, the patient has consented to  Procedure(s) with comments: COLONOSCOPY (N/A) - 730 - interpreter scheduled do NOT move ESOPHAGOGASTRODUODENOSCOPY (EGD) (N/A) as a surgical intervention .  The patient's history has been reviewed, patient examined, no change in status, stable for surgery.  I have reviewed the patient's chart and labs.  Questions were answered to the patient's satisfaction.     Fahmida Jurich  No change. EGD and colonoscopy per plan.  The risks, benefits, limitations, imponderables and alternatives regarding both EGD and colonoscopy have been reviewed with the patient. Questions have been answered. All parties agreeable.

## 2015-09-15 NOTE — Op Note (Signed)
[facility] Patient Name: Ana Coleman Procedure Date: 09/15/2015 7:34 AM MRN: [id number] Date of Birth: 1960-08-10 Attending MD: Gennette Pacobert Michael Jordynn Marcella , MD CSN: [id number] Age: [id number] Admit Type: Outpatient Procedure:                Upper GI endoscopy Indications:              Epigastric abdominal pain Providers:                Gennette Pacobert Michael Jakye Mullens, MD, Loma MessingLurae B. Mathis FareAlbert RN, RN,                            Birder Robsonebra Houghton, Technician Referring MD:              Medicines:                Midazolam 3 mg IV, Meperidine 75 mg IV, Ondansetron                            4 mg IV Complications:            No immediate complications. Estimated Blood Loss:     Estimated blood loss was minimal. Procedure:                Pre-Anesthesia Assessment:                           - Prior to the procedure, a History and Physical                            was performed, and patient medications and                            allergies were reviewed. The patient's tolerance of                            previous anesthesia was also reviewed. The risks                            and benefits of the procedure and the sedation                            options and risks were discussed with the patient.                            All questions were answered, and informed consent                            was obtained. Prior Anticoagulants: The patient has                            taken no previous anticoagulant or antiplatelet                            agents. ASA Grade Assessment: II - A patient with  mild systemic disease. After reviewing the risks                            and benefits, the patient was deemed in                            satisfactory condition to undergo the procedure.                           After obtaining informed consent, the endoscope was                            passed under direct vision. Throughout the                            procedure,  the patient's blood pressure, pulse, and                            oxygen saturations were monitored continuously. The                            [id number] ([id number]) scope was introduced through the                            mouth, and advanced to the second part of duodenum.                            The upper GI endoscopy was accomplished without                            difficulty. The patient tolerated the procedure                            well. Scope In: 7:54:34 AM Scope Out: 7:57:39 AM Total Procedure Duration: 0 hours 3 minutes 5 seconds  Findings:      The examined esophagus was normal.      Patchy moderately erythematous mucosa without bleeding was found in the       stomach.      Multiple erosions were found in the gastric antrum. Biopsies for       histology were taken with a cold forceps for evaluation of celiac       disease. This was biopsied with a cold forceps for histology. Estimated       blood loss was minimal.      The duodenal bulb and second portion of the duodenum were normal. Impression:               - Normal esophagus.                           - Erythematous mucosa in the stomach.                           - Erosive gastropathy. Biopsied.                           -  Normal duodenal bulb and second portion of the                            duodenum. Moderate Sedation:      Moderate (conscious) sedation was administered by the endoscopy nurse       and supervised by the endoscopist. [Parameters Monitored]. [Procedure       Duration Time]. Recommendation:           - Patient has a contact number available for                            emergencies. The signs and symptoms of potential                            delayed complications were discussed with the                            patient. Return to normal activities tomorrow.                            Written discharge instructions were provided to the                            patient.                            - Advance diet as tolerated.                           - Continue present medications.                           - Await pathology results. Procedure Code(s):        --- Professional ---                           [phone], Esophagogastroduodenoscopy, flexible,                            transoral; with biopsy, single or multiple Diagnosis Code(s):        --- Professional ---                           K31.89, Other diseases of stomach and duodenum                           R10.13, Epigastric pain CPT copyright 2016 American Medical Association. All rights reserved. The codes documented in this report are preliminary and upon coder review may  be revised to meet current compliance requirements. Gerrit Friendsobert M. Benedicta Sultan, MD Gennette Pacobert Michael Jaizon Deroos, MD 09/15/2015 8:02:10 AM This report has been signed electronically. Number of Addenda: 0

## 2015-09-17 ENCOUNTER — Encounter (HOSPITAL_COMMUNITY): Payer: Self-pay | Admitting: Internal Medicine

## 2015-09-19 ENCOUNTER — Encounter: Payer: Self-pay | Admitting: Internal Medicine

## 2015-09-21 ENCOUNTER — Telehealth: Payer: Self-pay

## 2015-09-21 ENCOUNTER — Encounter: Payer: Self-pay | Admitting: Internal Medicine

## 2015-09-21 NOTE — Telephone Encounter (Signed)
Per RMR- Send letter to patient.  Send copy of letter with path to referring provider and PCP.   Needs ov in next several weeks w extender re: diarrhea if not already made

## 2015-09-21 NOTE — Telephone Encounter (Signed)
Letter mailed to the pt. 

## 2015-09-21 NOTE — Telephone Encounter (Signed)
OV made and letter mailed °

## 2015-10-15 ENCOUNTER — Other Ambulatory Visit: Payer: Self-pay

## 2015-10-15 ENCOUNTER — Ambulatory Visit (INDEPENDENT_AMBULATORY_CARE_PROVIDER_SITE_OTHER): Payer: Self-pay | Admitting: Gastroenterology

## 2015-10-15 ENCOUNTER — Encounter: Payer: Self-pay | Admitting: Gastroenterology

## 2015-10-15 VITALS — BP 125/85 | HR 60 | Temp 98.0°F | Ht 62.0 in | Wt 222.2 lb

## 2015-10-15 DIAGNOSIS — K2990 Gastroduodenitis, unspecified, without bleeding: Secondary | ICD-10-CM

## 2015-10-15 DIAGNOSIS — K439 Ventral hernia without obstruction or gangrene: Secondary | ICD-10-CM

## 2015-10-15 DIAGNOSIS — K2970 Gastritis, unspecified, without bleeding: Secondary | ICD-10-CM | POA: Insufficient documentation

## 2015-10-15 NOTE — Assessment & Plan Note (Signed)
Continue omeprazole at least 3 months. At that time she can try coming off the medication but if she has recurrent heartburn, epigastric burning, pain she will resume medication. Return to the office as needed.

## 2015-10-15 NOTE — Progress Notes (Signed)
cc'ed to pcp °

## 2015-10-15 NOTE — Assessment & Plan Note (Signed)
Ventral hernia without obstruction but is symptomatic. Significant defect in the abdominal wall. Patient is currently applying for [facility] financial assistance. Surgical referral planned. Discussed warning signs and symptoms with patient today.  Please note, Goggle translator was used to provide patient instruction translation into [place]. Reviewed by translator present today who reported that the information was appropriately translated. Verbal instructions provided to the patient as well.

## 2015-10-15 NOTE — Patient Instructions (Addendum)
Contine omeprazol una vez al da durante 3 meses. En ese momento, puede detener el medicamento. Si tiene ardor de Development worker, communityestmago recurrente, o ardor / dolor en la parte superior del abdomen, debe reiniciar el omeprazol una vez al da segn sea necesario.  Le recomendaremos que tenga una reparacin [place] de su hernia abdominal. Si usted desarrolla dolor persistente y significativo en el sitio de la hernia que no desaparece si aplica presin, debe ir a la sala de emergencias.  Regrese a nuestra oficina segn sea necesario.

## 2015-10-15 NOTE — Progress Notes (Signed)
      Primary Care Physician: Tylene FantasiaMUSE,ROCHELLE D., PA-C  Primary Gastroenterologist:  Roetta SessionsMichael Rourk, MD   Chief Complaint  Patient presents with  . Abdominal Pain    upper abd pain--hernia    HPI: Ana Coleman is a 55 y.o. female here for follow-up. She was seen back in July for epigastric pain, hard knot/growth in the epigastric area. She was also having 4 month history of diarrhea. On exam she was found to have a baseball sized abdominal hernia in the epigastric area. She had typical GERD symptoms, we started Prilosec. GI pathogen panel negative. C. difficile Quick screen negative. EGD showed chronicAtrophic gastritis, no H pylori area colonoscopy showed diverticulosis, random colon biopsies were benign, no evidence of microscopic colitis. Distal terminal ileum appeared normal.  Patient reports that her diarrhea has resolved. She thinks it was related to a tea that she was consuming. She continues to have epigastric discomfort especially when she does her daily activities, move side to side in the bed, getting up and down. She feels a pulling and tugging painful discomfort in the upper abdomen at site of her hernia. Symptoms are not worse with meals. No longer has any heartburn. Bowel movements are regular. No blood in the stool or melena.  Current Outpatient Prescriptions  Medication Sig Dispense Refill  . citalopram (CELEXA) 20 MG tablet Take 20 mg by mouth daily.    Marland Kitchen. omeprazole (PRILOSEC) 20 MG capsule Take 1 capsule (20 mg total) by mouth daily. 20 capsule 0  . simvastatin (ZOCOR) 20 MG tablet Take 20 mg by mouth daily.     No current facility-administered medications for this visit.     Allergies as of 10/15/2015  . (No Known Allergies)    ROS:  General: Negative for anorexia, weight loss, fever, chills, fatigue, weakness. ENT: Negative for hoarseness, difficulty swallowing , nasal congestion. CV: Negative for chest pain, angina, palpitations, dyspnea on exertion,  peripheral edema.  Respiratory: Negative for dyspnea at rest, dyspnea on exertion, cough, sputum, wheezing.  GI: See history of present illness. GU:  Negative for dysuria, hematuria, urinary incontinence, urinary frequency, nocturnal urination.  Endo: Negative for unusual weight change.    Physical Examination:   BP 125/85   Pulse 60   Temp 98 F (36.7 C) (Oral)   Ht 5\' 2"  (1.575 m)   Wt 222 lb 3.2 oz (100.8 kg)   BMI 40.64 kg/m   General: Well-nourished, well-developed in no acute distress.  Eyes: No icterus. Mouth: Oropharyngeal mucosa moist and pink , no lesions erythema or exudate. Lungs: Clear to auscultation bilaterally.  Heart: Regular rate and rhythm, no murmurs rubs or gallops.  Abdomen: Bowel sounds are normal, baseball sized ventral hernia in the upper abdomen which is easily reducible and nontender, containing bowel,  no hepatosplenomegaly or masses, no abdominal bruits or hernia , no rebound or guarding.   Extremities: No lower extremity edema. No clubbing or deformities. Neuro: Alert and oriented x 4   Skin: Warm and dry, no jaundice.   Psych: Alert and cooperative, normal mood and affect.

## 2015-10-19 ENCOUNTER — Telehealth: Payer: Self-pay

## 2015-10-19 NOTE — Telephone Encounter (Signed)
If patient has [facility] Assistance, we should send referral to a surgeon who accepts [facility] assistance, ie [facility] or [facility]. Ginger should have the information.  To my understanding, patient was waiting to hear if she qualified [facility]. Once she is approved, then refer should be initiated.

## 2015-10-19 NOTE — Telephone Encounter (Signed)
Called pt via interpretor line. Informed pt that Dr. Jenkins officeLovell Sheehan called yesterday and stated pt's financial assistance was denied and she would need $1900 upfront. Pt unable to proceed. Gave pt phone number to Dr. Lovell SheehanJenkins office.

## 2015-10-26 ENCOUNTER — Encounter: Payer: Self-pay | Admitting: General Practice

## 2015-10-26 ENCOUNTER — Other Ambulatory Visit: Payer: Self-pay

## 2015-10-26 DIAGNOSIS — K439 Ventral hernia without obstruction or gangrene: Secondary | ICD-10-CM

## 2015-10-26 NOTE — Telephone Encounter (Signed)
Attempted to call pt via interpretor line. Pt's [place] stated that [place] was [place] but he would give message to her. Informed her husband that referral had been [facility] to [facility] [place]. Receptionist had advised $50 office visit. Will notify pt when more info is received. Called Dr. Lovell SheehanJenkins office this morning to see if pt had tried to call their office and was informed she had not called.

## 2015-11-10 ENCOUNTER — Telehealth: Payer: Self-pay

## 2015-11-10 NOTE — Telephone Encounter (Signed)
Called patient to ask her if she was ready to schedule her consult. Patient stated that she was a self pay and did not have the money to pay at this time. I told her that on her consult she would have to pay $50 and that the rest would be mailed. However, she stated that she did not have that amount to give at this time. In addition, patient stated that had applied to [place] [employer] [facility] and was awaiting for a response. I told her to give us a call once she was ready to schedule her consult. Patient understood.

## 2016-05-01 ENCOUNTER — Emergency Department (HOSPITAL_COMMUNITY)
Admission: EM | Admit: 2016-05-01 | Discharge: 2016-05-01 | Disposition: A | Discharge status: Home / Self Care | Payer: Self-pay | Attending: Emergency Medicine | Admitting: Emergency Medicine

## 2016-05-01 ENCOUNTER — Encounter (HOSPITAL_COMMUNITY): Payer: Self-pay

## 2016-05-01 ENCOUNTER — Emergency Department (HOSPITAL_COMMUNITY): Payer: Self-pay

## 2016-05-01 DIAGNOSIS — Z79899 Other long term (current) drug therapy: Secondary | ICD-10-CM | POA: Insufficient documentation

## 2016-05-01 DIAGNOSIS — K439 Ventral hernia without obstruction or gangrene: Secondary | ICD-10-CM | POA: Insufficient documentation

## 2016-05-01 DIAGNOSIS — R21 Rash and other nonspecific skin eruption: Secondary | ICD-10-CM | POA: Insufficient documentation

## 2016-05-01 DIAGNOSIS — R109 Unspecified abdominal pain: Secondary | ICD-10-CM

## 2016-05-01 DIAGNOSIS — N390 Urinary tract infection, site not specified: Secondary | ICD-10-CM | POA: Insufficient documentation

## 2016-05-01 LAB — URINALYSIS, ROUTINE W REFLEX MICROSCOPIC
Bilirubin Urine: NEGATIVE
Glucose, UA: NEGATIVE mg/dL
Hgb urine dipstick: NEGATIVE
Ketones, ur: NEGATIVE mg/dL
NITRITE: NEGATIVE
PROTEIN: NEGATIVE mg/dL
SPECIFIC GRAVITY, URINE: 1.038 — AB (ref 1.005–1.030)
pH: 5 (ref 5.0–8.0)

## 2016-05-01 LAB — COMPREHENSIVE METABOLIC PANEL
ALBUMIN: 3.9 g/dL (ref 3.5–5.0)
ALK PHOS: 105 U/L (ref 38–126)
ALT: 24 U/L (ref 14–54)
ANION GAP: 8 (ref 5–15)
AST: 24 U/L (ref 15–41)
BILIRUBIN TOTAL: 0.7 mg/dL (ref 0.3–1.2)
BUN: 9 mg/dL (ref 6–20)
CALCIUM: 9.1 mg/dL (ref 8.9–10.3)
CO2: 23 mmol/L (ref 22–32)
Chloride: 106 mmol/L (ref 101–111)
Creatinine, Ser: 0.54 mg/dL (ref 0.44–1.00)
GFR calc Af Amer: >60 mL/min (ref >60)
GFR calc non Af Amer: >60 mL/min (ref >60)
GLUCOSE: 105 mg/dL — AB (ref 65–99)
Potassium: 3.8 mmol/L (ref 3.5–5.1)
SODIUM: 137 mmol/L (ref 135–145)
TOTAL PROTEIN: 7.4 g/dL (ref 6.5–8.1)

## 2016-05-01 LAB — CBC
HEMATOCRIT: 43.7 % (ref 36.0–46.0)
Hemoglobin: 14.7 g/dL (ref 12.0–15.0)
MCH: 30.5 pg (ref 26.0–34.0)
MCHC: 33.6 g/dL (ref 30.0–36.0)
MCV: 90.7 fL (ref 78.0–100.0)
Platelets: 209 10*3/uL (ref 150–400)
RBC: 4.82 MIL/uL (ref 3.87–5.11)
RDW: 12.3 % (ref 11.5–15.5)
WBC: 5.9 10*3/uL (ref 4.0–10.5)

## 2016-05-01 LAB — LIPASE, BLOOD: Lipase: 14 U/L (ref 11–51)

## 2016-05-01 MED ORDER — ONDANSETRON HCL 4 MG/2ML IJ SOLN
4.0000 mg | Freq: Once | INTRAMUSCULAR | Status: AC
  Administered 2016-05-01: 4 mg via INTRAVENOUS
  Filled 2016-05-01: qty 2

## 2016-05-01 MED ORDER — DEXTROSE 5 % IV SOLN
1.0000 g | Freq: Once | INTRAVENOUS | Status: AC
  Administered 2016-05-01: 1 g via INTRAVENOUS
  Filled 2016-05-01: qty 10

## 2016-05-01 MED ORDER — MORPHINE SULFATE (PF) 4 MG/ML IV SOLN
4.0000 mg | Freq: Once | INTRAVENOUS | Status: AC
  Administered 2016-05-01: 4 mg via INTRAVENOUS
  Filled 2016-05-01: qty 1

## 2016-05-01 MED ORDER — SODIUM CHLORIDE 0.9 % IV BOLUS (SEPSIS)
500.0000 mL | Freq: Once | INTRAVENOUS | Status: AC
  Administered 2016-05-01: 500 mL via INTRAVENOUS

## 2016-05-01 MED ORDER — ONDANSETRON HCL 4 MG PO TABS
4.0000 mg | ORAL_TABLET | Freq: Three times a day (TID) | ORAL | 0 refills | Status: DC | PRN
Start: 2016-05-01 — End: 2016-12-26

## 2016-05-01 MED ORDER — CEPHALEXIN 500 MG PO CAPS: 500.0000 mg | ORAL_CAPSULE | Freq: Three times a day (TID) | ORAL | 0 refills | Status: DC

## 2016-05-01 MED ORDER — HYDROCODONE-ACETAMINOPHEN 5-325 MG PO TABS: 1.0000 | ORAL_TABLET | ORAL | 0 refills | Status: DC | PRN

## 2016-05-01 MED ORDER — IOPAMIDOL (ISOVUE-300) INJECTION 61%
INTRAVENOUS | Status: AC
  Administered 2016-05-01: 100 mL
  Filled 2016-05-01: qty 100

## 2016-05-01 NOTE — ED Provider Notes (Signed)
[facility] DEPT Provider Note   CSN: [id number] Arrival date & time: 05/01/16  1043     History   Chief Complaint Chief Complaint  Patient presents with  . Hernia    HPI Ana Coleman is a 56 y.o. female.  HPI   Patient with known ventral hernia p/w left flank pain, nonbloody diarrhea followed by constipation, decreased urination, nausea x 4 days.  The pain is constant, described as pulling.  Had 6-7 bowel movements yesterday.  Has had no bowel movement or flatus since 6pm yesterday.  Urinated this morning at 6am and it was very little and burned.  Last night she did feel anxious with CP, SOB, palpitations but this is similar to her prior panic attacks/anxiety.  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  Denies vomiting.  No hx kidney stones.    Past Medical History:  Diagnosis Date  . Bronchitis   . Hyperlipidemia     Patient Active Problem List   Diagnosis Date Noted  . Gastritis and gastroduodenitis 10/15/2015  . Chronic diarrhea 08/20/2015  . Abdominal pain, epigastric 08/20/2015  . Ventral hernia without obstruction or gangrene 08/20/2015  . GERD (gastroesophageal reflux disease) 08/20/2015  . Fatty liver 08/20/2015    Past Surgical History:  Procedure Laterality Date  . BIOPSY  09/15/2015   Procedure: BIOPSY;  Surgeon: Corbin Ade, MD;  Location: [facility];  Service: Endoscopy;;  gastric & colon  . CHOLECYSTECTOMY  2000   found a tumor at the same time  . COLONOSCOPY N/A 09/15/2015   Procedure: COLONOSCOPY;  Surgeon: Corbin Ade, MD;  Location: [facility];  Service: Endoscopy;  Laterality: N/A;  730 - interpreter scheduled do NOT move  . ESOPHAGOGASTRODUODENOSCOPY N/A 09/15/2015   Procedure: ESOPHAGOGASTRODUODENOSCOPY (EGD);  Surgeon: Corbin Ade, MD;  Location: [facility];  Service: Endoscopy;  Laterality: N/A;  . TUBAL LIGATION       OB History    No data available       Home Medications    Prior to Admission medications   Medication Sig Start Date End Date Taking? Authorizing Provider  cephALEXin (KEFLEX) 500 MG capsule Take 1 capsule (500 mg total) by mouth 3 (three) times daily. 05/01/16   Trixie Dredge, PA-C  citalopram (CELEXA) 20 MG tablet Take 20 mg by mouth daily.    Historical Provider, MD  HYDROcodone-acetaminophen (NORCO/VICODIN) 5-325 MG tablet Take 1 tablet by mouth every 4 (four) hours as needed for moderate pain or severe pain. 05/01/16   Trixie Dredge, PA-C  omeprazole (PRILOSEC) 20 MG capsule Take 1 capsule (20 mg total) by mouth daily. 08/20/15   Tiffany Kocher, PA-C  ondansetron (ZOFRAN) 4 MG tablet Take 1 tablet (4 mg total) by mouth every 8 (eight) hours as needed for nausea or vomiting. 05/01/16   Trixie Dredge, PA-C  simvastatin (ZOCOR) 20 MG tablet Take 20 mg by mouth daily.    Historical Provider, MD    Family History Family History  Problem Relation Age of Onset  . Pancreatic cancer Maternal Grandmother   . Colon cancer Neg Hx   . Liver disease Neg Hx     Social History Social History  Substance Use Topics  . Smoking status: Never Smoker  . Smokeless tobacco: Never Used  . Alcohol use No     Allergies   Patient has no known allergies.  Review of Systems Review of Systems  All other systems reviewed and are negative.    Physical Exam Updated Vital Signs BP 112/74   Pulse 64   Temp 98 F (36.7 C) (Oral)   Resp 15   SpO2 98%   Physical Exam  Constitutional: She appears well-developed and well-nourished. No distress.  HENT:  Head: Normocephalic and atraumatic.  Neck: Neck supple.  Cardiovascular: Normal rate and regular rhythm.   Pulmonary/Chest: Effort normal and breath sounds normal. No respiratory distress. She has no wheezes. She has no rales.  Abdominal: Soft. She exhibits no distension. There is tenderness. There is no rebound and no guarding.  Diffuse tenderness  left flank.  Small erythematous spot with vesicles overlying just left of lumbar spine.  Tenderness is not entirely dermatomal.  No extension of rash.    Neurological: She is alert.  Skin: She is not diaphoretic.  Nursing note and vitals reviewed.    ED Treatments / Results  Labs (all labs ordered are listed, but only abnormal results are displayed) Labs Reviewed  COMPREHENSIVE METABOLIC PANEL - Abnormal; Notable for the following:       Result Value   Glucose, Bld 105 (*)    All other components within normal limits  URINALYSIS, ROUTINE W REFLEX MICROSCOPIC - Abnormal; Notable for the following:    APPearance HAZY (*)    Specific Gravity, Urine 1.038 (*)    Leukocytes, UA MODERATE (*)    Bacteria, UA FEW (*)    Squamous Epithelial / LPF 6-30 (*)    All other components within normal limits  LIPASE, BLOOD  CBC    EKG  EKG Interpretation None       Radiology Ct Abdomen Pelvis W Contrast  Result Date: 05/01/2016 CLINICAL DATA:  Left flank pain.  Diarrhea.  Prior cholecystectomy. EXAM: CT ABDOMEN AND PELVIS WITH CONTRAST TECHNIQUE: Multidetector CT imaging of the abdomen and pelvis was performed using the standard protocol following bolus administration of intravenous contrast. CONTRAST:  ISOVUE-300 IOPAMIDOL (ISOVUE-300) INJECTION 61% COMPARISON:  04/12/2015 abdominal sonogram. FINDINGS: Lower chest: No significant pulmonary nodules or acute consolidative airspace disease. 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 Hepatobiliary: Diffuse hepatic steatosis. No liver mass. No definite liver surface irregularity. Cholecystectomy. No intrahepatic biliary ductal dilatation. Common bile duct diameter 8 mm, within normal post cholecystectomy limits. Pancreas: Normal, with no mass or duct dilation. Spleen: Normal size. No mass. Adrenals/Urinary Tract: Normal adrenals. No contour deforming renal masses. Simple parapelvic renal cysts in both kidneys. No hydronephrosis.  Normal bladder. Stomach/Bowel: Grossly normal stomach. Normal caliber small bowel with no small bowel wall thickening. Normal appendix. There is a moderate to large midline supraumbilical hernia containing a portion of the mid transverse colon. No colonic wall thickening or pneumatosis. No focal large bowel caliber transition. Physiologic stool throughout the large bowel. No significant pericolonic fat stranding. Vascular/Lymphatic: Normal caliber abdominal aorta. Patent portal, splenic, hepatic and renal veins. No pathologically enlarged lymph nodes in the abdomen or pelvis. Reproductive: Mildly enlarged myomatous uterus with coarsely calcified small left anterior uterine fibroid and dominant noncalcified 4.0 cm right anterior fundal uterine fibroid. No adnexal mass. Other: No pneumoperitoneum, ascites or focal fluid collection. Musculoskeletal: No aggressive appearing focal osseous lesions. Mild-to-moderate thoracolumbar spondylosis. IMPRESSION: 1. Moderate to large supraumbilical midline hernia contains a portion of the mid transverse colon, with no evidence of bowel ischemia or obstruction. No evidence of acute bowel inflammation. 2. Diffuse hepatic steatosis. 3. Simple parapelvic renal cysts in both kidneys.  No hydronephrosis. 4. Enlarged myomatous uterus. 5. Prominent right anterior diaphragmatic eventration. Electronically Signed   By: Delbert Phenix M.D.   On: 05/01/2016 14:30    Procedures Procedures (including critical care time)  Medications Ordered in ED Medications  morphine 4 MG/ML injection 4 mg (4 mg Intravenous Given 05/01/16 1329)  ondansetron (ZOFRAN) injection 4 mg (4 mg Intravenous Given 05/01/16 1329)  sodium chloride 0.9 % bolus 500 mL (0 mLs Intravenous Stopped 05/01/16 1529)  iopamidol (ISOVUE-300) 61 % injection (100 mLs  Contrast Given 05/01/16 1353)  cefTRIAXone (ROCEPHIN) 1 g in dextrose 5 % 50 mL IVPB (1 g Intravenous New Bag/Given 05/01/16 1553)     Initial Impression / Assessment  and Plan / ED Course  I have reviewed the triage vital signs and the nursing notes.  Pertinent labs & imaging results that were available during my care of the patient were reviewed by me and considered in my medical decision making (see chart for details).    Afebrile, nontoxic patient with left flank pain, urinary symptoms.  Found to have UTI.   Pt with ventral hernia that is not currently obstructing.  Pt does have small area of rash on her back of unclear significance.  The pain is not dermatomal.  I doubt zoster.  I have discussed this at length with family and have advised close follow up with PCP and reassessment for change of or worsening symptoms.   D/C home with keflex, norco, zofran.  Close PCP follow up, general surgery referral for hernia.   Discussed result, findings, treatment, and follow up  with patient.  Pt given return precautions.  Pt verbalizes understanding and agrees with plan.       Final Clinical Impressions(s) / ED Diagnoses   Final diagnoses:  Left flank pain  Urinary tract infection without hematuria, site unspecified  Rash    New Prescriptions New Prescriptions   CEPHALEXIN (KEFLEX) 500 MG CAPSULE    Take 1 capsule (500 mg total) by mouth 3 (three) times daily.   HYDROCODONE-ACETAMINOPHEN (NORCO/VICODIN) 5-325 MG TABLET    Take 1 tablet by mouth every 4 (four) hours as needed for moderate pain or severe pain.   ONDANSETRON (ZOFRAN) 4 MG TABLET    Take 1 tablet (4 mg total) by mouth every 8 (eight) hours as needed for nausea or vomiting.     Trixie Dredge, PA-C 05/01/16 1705    Azalia Bilis, MD 05/01/16 [phone]

## 2016-05-01 NOTE — ED Triage Notes (Signed)
Pt has abd hernia X2 years. She has had increasing pain X2 days that radiates to her back. Pt has also had nausea and vomiting.

## 2016-05-01 NOTE — Discharge Instructions (Signed)
Read the information below.  Use the prescribed medication as directed.  Please discuss all new medications with your pharmacist.  Do not take additional tylenol while taking the prescribed pain medication to avoid overdose.  You may return to the Emergency Department at any time for worsening condition or any new symptoms that concern you.     If you develop high fevers, worsening abdominal pain, uncontrolled vomiting, or are unable to tolerate fluids by mouth, return to the ER for a recheck.    Lea la informacin a continuacin. Use la medicacin prescrita segn lo dirigido. Discuta todos los medicamentos nuevos con su farmacutico. No tome tylenol adicional mientras toma el medicamento recetado para evitar la sobredosis. Puede regresar al [facility] en cualquier momento por empeoramiento de la condicin o cualquier sntoma nuevo que le preocupe. Si desarrolla fiebre alta, empeoramiento del dolor abdominal, vmitos descontrolados o no puede tolerar los lquidos por la boca, regrese a la sala de emergencias para una nueva verificacin.

## 2016-05-01 NOTE — ED Notes (Signed)
Pt is in stable condition upon d/c and ambulates from ED. 

## 2016-09-18 ENCOUNTER — Ambulatory Visit: Payer: Self-pay | Admitting: General Surgery

## 2016-09-18 NOTE — H&P (Signed)
History of Present Illness Axel Filler MD; 09/18/2016 11:27 AM) The patient is a 56 year old female who presents with an incisional hernia. Referred by: Dr. Caryn Bee compos Chief Complaint: Incisional hernia  Patient is a 56 year old female, Spanish-speaking, with a history of GERD, and ventral hernia.  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 She states she is not sure whether or not this is associated with her liver. She states that approximately 2 years ago she noticed a small bulge to her upper midline incision. She states that over time this has gotten bigger. She states this was likely result from lifting and carrying heavy things. She states that she has had no signs or symptoms of incarceration or strangulation. He states that the pain is acute at times. It is not incapacitating. There are no other modifying factors.  Patient did have a CT scan of the ER which revealed an incisional hernia approximately 7 cm at the neck. This appeared to be incarcerated parts of her colon and omentum. I personally reviewed the CT studies myself.   Past Surgical History Christianne Dolin, [place]; 09/18/2016 11:03 AM) Gallbladder Surgery - Open   Diagnostic Studies History Christianne Dolin, [place]; 09/18/2016 11:03 AM) Colonoscopy  within last year Mammogram  1-3 years ago Pap Smear  1-5 years ago  Allergies Christianne Dolin, RMA; 09/18/2016 11:03 AM) No Known Allergies 09/18/2016  Medication History Christianne Dolin, RMA; 09/18/2016 11:04 AM) No Current Medications Medications Reconciled  Social History Christianne Dolin, RMA; 09/18/2016 11:03 AM) Caffeine use  Coffee. No alcohol use  No drug use  Tobacco use  Never smoker.  Family History Christianne Dolin, [place]; 09/18/2016 11:03 AM) Alcohol Abuse  Brother. Arthritis   Father. Cerebrovascular Accident  Brother. Migraine Headache  Brother, Sister.  Pregnancy / Birth History Christianne Dolin, [place]; 09/18/2016 11:03 AM) Age at menarche  14 years. Age of menopause  <45 Gravida  3 Irregular periods  Length (months) of breastfeeding  7-12 Maternal age  6-20 Para  3  Other Problems Christianne Dolin, [place]; 09/18/2016 11:03 AM) Anxiety Disorder  Arthritis  Back Pain  Bladder Problems  Depression  Ventral Hernia Repair     Review of Systems Axel Filler MD; 09/18/2016 11:24 AM) General Present- Fatigue, Night Sweats and Weight Gain. Not Present- Appetite Loss, Chills, Fever and Weight Loss. Skin Not Present- Change in Wart/Mole, Dryness, Hives, Jaundice, New Lesions, Non-Healing Wounds, Rash and Ulcer. HEENT Present- Earache. Not Present- Hearing Loss, Hoarseness, Nose Bleed, Oral Ulcers, Ringing in the Ears, Seasonal Allergies, Sinus Pain, Sore Throat, Visual Disturbances, Wears glasses/contact lenses and Yellow Eyes. Respiratory Present- Difficulty Breathing, Snoring and Wheezing. Not Present- Bloody sputum and Chronic Cough. Breast Present- Breast Pain. Not Present- Breast Mass, Nipple Discharge and Skin Changes. Cardiovascular Present- Chest Pain, Difficulty Breathing Lying Down, Leg Cramps, Rapid Heart Rate, Shortness of Breath and Swelling of Extremities. Not Present- Palpitations. Gastrointestinal Present- Abdominal Pain, Bloating and Hemorrhoids. Not Present- Bloody Stool, Change in Bowel Habits, Chronic diarrhea, Constipation, Difficulty Swallowing, Excessive gas, Gets full quickly at meals, Indigestion, Nausea, Rectal Pain and Vomiting. Female Genitourinary Present- Frequency, Nocturia and Pelvic Pain. Not Present- Painful Urination and Urgency. Musculoskeletal Present- Back Pain and Muscle Pain. Not Present- Joint Pain, Joint Stiffness, Muscle Weakness and Swelling of Extremities. Neurological Present- Decreased Memory, Headaches,  Numbness and Tingling. Not Present- Fainting, Seizures, Tremor, Trouble walking and Weakness.  Psychiatric Present- Anxiety, Change in Sleep Pattern, Depression, Fearful and Frequent crying. Not Present- Bipolar. Endocrine Present- Cold Intolerance and Hair Changes. Not Present- Excessive Hunger, Heat Intolerance, Hot flashes and New Diabetes. All other systems negative  Vitals Christianne Dolin RMA; 09/18/2016 11:04 AM) 09/18/2016 11:04 AM Weight: 232.2 lb Height: 62in Body Surface Area: 2.04 m Body Mass Index: 42.47 kg/m  Temp.: 98.45F  Pulse: 72 (Regular)  BP: 110/80 (Sitting, Left Arm, Standard)       Physical Exam Axel Filler MD; 09/18/2016 11:28 AM) The physical exam findings are as follows: Note:Constitutional: No acute distress, conversant, appears stated age  Eyes: Anicteric sclerae, moist conjunctiva, no lid lag  Neck: No thyromegaly, trachea midline, no cervical lymphadenopathy  Lungs: Clear to auscultation biilaterally, normal respiratory effot  Cardiovascular: regular rate & rhythm, no murmurs, no peripheal edema, pedal pulses 2+  GI: Soft, no masses or hepatosplenomegaly, non-tender to palpation  MSK: Normal gait, no clubbing cyanosis, edema  Skin: No rashes, palpation reveals normal skin turgor  Psychiatric: Appropriate judgment and insight, oriented to person, place, and time  Abdomen Inspection Hernias - Incisional - Reducible(Upper midline incisional hernia, reducible Long midline incision).    Assessment & Plan Axel Filler MD; 09/18/2016 11:28 AM) Sherald Hess HERNIA, WITHOUT OBSTRUCTION OR GANGRENE (K43.2) Impression: Patient is a 56 year old female with an incisional hernia. 1. The patient like to proceed to the operative for a open incisional hernia repair with mesh 2. All risks and benefits were discussed with the patient to generally include, but not limited to: infection, bleeding, damage to surrounding structures, acute and  chronic nerve pain, and recurrence. Alternatives were offered and described. All questions were answered and the patient voiced understanding of the procedure and wishes to proceed at this point with hernia repair.

## 2016-12-20 NOTE — Progress Notes (Signed)
05-01-16 (Epic) EKG, CT Abd/Pelvis w/contrast

## 2016-12-20 NOTE — Patient Instructions (Addendum)
Ana DarterMaria G [facility]  12/20/2016   Your procedure is scheduled on: 12-29-16   [facility]  Entrance [place]  elevators to 3rd floor to  Short Stay Center at 9:00 AM.   Call this number if you have problems the morning of surgery [phone]   Remember: ONLY 1 PERSON MAY GO WITH YOU TO SHORT STAY TO GET  READY MORNING OF YOUR SURGERY.  Do not eat food or drink liquids :After Midnight.     Take these medicines the morning of surgery with A SIP OF WATER: None                               You may not have any metal on your body including hair pins and              piercings  Do not wear jewelry, make-up, lotions, powders or perfumes, deodorant             Do not wear nail polish.  Do not shave  48 hours prior to surgery.             Do not bring valuables to the hospital. [facility] IS NOT             RESPONSIBLE   FOR VALUABLES.  Contacts, dentures or bridgework may not be worn into surgery.  Leave suitcase in the car. After surgery it may be brought to your room.                Please read over the following fact sheets you were given: _____________________________________________________________________             [facility] - Pre[facility] Before surgery, you can play an important role.  Because skin is not sterile, your skin needs to be as free of germs as possible.  You can reduce the number of germs on your skin by washing with CHG (chlorahexidine gluconate) soap before surgery.  CHG is an antiseptic cleaner which kills germs and bonds with the skin to continue killing germs even after washing. Please DO NOT use if you have an allergy to CHG or antibacterial soaps.  If your skin becomes reddened/irritated stop using the CHG and inform your nurse when you arrive at Short Stay. Do not shave (including legs and underarms) for at least 48 hours prior to the first CHG shower.  You may shave your face/neck. Please follow these  instructions carefully:  1.  Shower with CHG Soap the night before surgery and the  morning of Surgery.  2.  If you choose to wash your hair, wash your hair first as usual with your  normal  shampoo.  3.  After you shampoo, rinse your hair and body thoroughly to remove the  shampoo.                           4.  Use CHG as you would any other liquid soap.  You can apply chg directly  to the skin and wash                       Gently with a scrungie or clean washcloth.  5.  Apply the CHG Soap to your body ONLY FROM THE NECK DOWN.  Do not use on face/ open                           Wound or open sores. Avoid contact with eyes, ears mouth and genitals (private parts).                       Wash face,  Genitals (private parts) with your normal soap.             6.  Wash thoroughly, paying special attention to the area where your surgery  will be performed.  7.  Thoroughly rinse your body with warm water from the neck down.  8.  DO NOT shower/wash with your normal soap after using and rinsing off  the CHG Soap.                9.  Pat yourself dry with a clean towel.            10.  Wear clean pajamas.            11.  Place clean sheets on your bed the night of your first shower and do not  sleep with pets. Day of Surgery : Do not apply any lotions/deodorants the morning of surgery.  Please wear clean clothes to the hospital/surgery center.  FAILURE TO FOLLOW THESE INSTRUCTIONS MAY RESULT IN THE CANCELLATION OF YOUR SURGERY PATIENT SIGNATURE_________________________________  NURSE SIGNATURE__________________________________  ________________________________________________________________________

## 2016-12-20 NOTE — Progress Notes (Signed)
Please place orders in Epic as patient has a pre-op appointment on 12/26/2016! Thank you! 

## 2016-12-26 ENCOUNTER — Encounter (HOSPITAL_COMMUNITY)
Admission: RE | Admit: 2016-12-26 | Discharge: 2016-12-26 | Disposition: A | Discharge status: Home / Self Care | Payer: Self-pay | Source: Ambulatory Visit | Attending: General Surgery | Admitting: General Surgery

## 2016-12-26 ENCOUNTER — Other Ambulatory Visit: Payer: Self-pay

## 2016-12-26 ENCOUNTER — Ambulatory Visit: Payer: Self-pay | Admitting: General Surgery

## 2016-12-26 ENCOUNTER — Encounter (HOSPITAL_COMMUNITY): Payer: Self-pay

## 2016-12-26 DIAGNOSIS — Z01812 Encounter for preprocedural laboratory examination: Secondary | ICD-10-CM | POA: Insufficient documentation

## 2016-12-26 HISTORY — DX: Anxiety disorder, unspecified: F41.9

## 2016-12-26 HISTORY — DX: Other fracture of unspecified lower leg, initial encounter for closed fracture: S82.899A

## 2016-12-26 LAB — CBC
HEMATOCRIT: 46.3 % — AB (ref 36.0–46.0)
HEMOGLOBIN: 15.8 g/dL — AB (ref 12.0–15.0)
MCH: 31.3 pg (ref 26.0–34.0)
MCHC: 34.1 g/dL (ref 30.0–36.0)
MCV: 91.9 fL (ref 78.0–100.0)
PLATELETS: 218 10*3/uL (ref 150–400)
RBC: 5.04 MIL/uL (ref 3.87–5.11)
RDW: 12.3 % (ref 11.5–15.5)
WBC: 7 10*3/uL (ref 4.0–10.5)

## 2016-12-26 LAB — HCG, SERUM, QUALITATIVE: Preg, Serum: NEGATIVE

## 2016-12-27 ENCOUNTER — Other Ambulatory Visit (HOSPITAL_COMMUNITY): Payer: Self-pay

## 2016-12-29 ENCOUNTER — Observation Stay (HOSPITAL_COMMUNITY)
Admission: RE | Admit: 2016-12-29 | Discharge: 2016-12-30 | Disposition: A | Discharge status: Home / Self Care | Payer: Self-pay | Source: Ambulatory Visit | Attending: General Surgery | Admitting: General Surgery

## 2016-12-29 ENCOUNTER — Encounter (HOSPITAL_COMMUNITY)
Admission: RE | Disposition: A | Discharge status: Home / Self Care | Payer: Self-pay | Source: Ambulatory Visit | Attending: General Surgery

## 2016-12-29 ENCOUNTER — Inpatient Hospital Stay (HOSPITAL_COMMUNITY): Payer: Self-pay

## 2016-12-29 ENCOUNTER — Other Ambulatory Visit: Payer: Self-pay

## 2016-12-29 ENCOUNTER — Encounter (HOSPITAL_COMMUNITY): Payer: Self-pay | Admitting: *Deleted

## 2016-12-29 DIAGNOSIS — F419 Anxiety disorder, unspecified: Secondary | ICD-10-CM | POA: Insufficient documentation

## 2016-12-29 DIAGNOSIS — Z79899 Other long term (current) drug therapy: Secondary | ICD-10-CM | POA: Insufficient documentation

## 2016-12-29 DIAGNOSIS — Z6839 Body mass index (BMI) 39.0-39.9, adult: Secondary | ICD-10-CM | POA: Insufficient documentation

## 2016-12-29 DIAGNOSIS — K219 Gastro-esophageal reflux disease without esophagitis: Secondary | ICD-10-CM | POA: Insufficient documentation

## 2016-12-29 DIAGNOSIS — Z8719 Personal history of other diseases of the digestive system: Secondary | ICD-10-CM

## 2016-12-29 DIAGNOSIS — K430 Incisional hernia with obstruction, without gangrene: Principal | ICD-10-CM | POA: Insufficient documentation

## 2016-12-29 DIAGNOSIS — Z9889 Other specified postprocedural states: Secondary | ICD-10-CM

## 2016-12-29 HISTORY — PX: INSERTION OF MESH: SHX5868

## 2016-12-29 HISTORY — PX: INCISIONAL HERNIA REPAIR: SHX193

## 2016-12-29 SURGERY — REPAIR, HERNIA, INCISIONAL
Anesthesia: Regional | Site: Abdomen

## 2016-12-29 MED ORDER — ROPIVACAINE HCL 7.5 MG/ML IJ SOLN
INTRAMUSCULAR | Status: DC | PRN
  Administered 2016-12-29 (×2): 10 mL via PERINEURAL

## 2016-12-29 MED ORDER — FENTANYL CITRATE (PF) 100 MCG/2ML IJ SOLN
INTRAMUSCULAR | Status: AC
  Filled 2016-12-29: qty 2

## 2016-12-29 MED ORDER — GABAPENTIN 300 MG PO CAPS
300.0000 mg | ORAL_CAPSULE | Freq: Two times a day (BID) | ORAL | Status: DC
  Administered 2016-12-29 – 2016-12-30 (×2): 300 mg via ORAL
  Filled 2016-12-29 (×2): qty 1

## 2016-12-29 MED ORDER — LACTATED RINGERS IV SOLN
INTRAVENOUS | Status: DC | PRN
  Administered 2016-12-29 (×2): via INTRAVENOUS

## 2016-12-29 MED ORDER — SUCCINYLCHOLINE CHLORIDE 200 MG/10ML IV SOSY
PREFILLED_SYRINGE | INTRAVENOUS | Status: AC
  Filled 2016-12-29: qty 10

## 2016-12-29 MED ORDER — BUPIVACAINE-EPINEPHRINE (PF) 0.5% -1:200000 IJ SOLN
INTRAMUSCULAR | Status: DC | PRN
  Administered 2016-12-29 (×2): 12 mL via PERINEURAL

## 2016-12-29 MED ORDER — CHLORHEXIDINE GLUCONATE CLOTH 2 % EX PADS: 6.0000 | MEDICATED_PAD | Freq: Once | CUTANEOUS | Status: DC

## 2016-12-29 MED ORDER — HYDROMORPHONE HCL 1 MG/ML IJ SOLN
0.5000 mg | INTRAMUSCULAR | Status: DC | PRN
  Administered 2016-12-29: 0.5 mg via INTRAVENOUS
  Filled 2016-12-29: qty 0.5

## 2016-12-29 MED ORDER — ACETAMINOPHEN 500 MG PO TABS
1000.0000 mg | ORAL_TABLET | ORAL | Status: AC
  Administered 2016-12-29: 1000 mg via ORAL
  Filled 2016-12-29: qty 2

## 2016-12-29 MED ORDER — GABAPENTIN 300 MG PO CAPS
300.0000 mg | ORAL_CAPSULE | ORAL | Status: AC
  Administered 2016-12-29: 300 mg via ORAL
  Filled 2016-12-29: qty 1

## 2016-12-29 MED ORDER — ENOXAPARIN SODIUM 40 MG/0.4ML ~~LOC~~ SOLN
40.0000 mg | SUBCUTANEOUS | Status: DC
  Administered 2016-12-30: 40 mg via SUBCUTANEOUS
  Filled 2016-12-29: qty 0.4

## 2016-12-29 MED ORDER — HYDROCODONE-ACETAMINOPHEN 5-325 MG PO TABS
1.0000 | ORAL_TABLET | ORAL | Status: DC | PRN
  Administered 2016-12-30: 1 via ORAL
  Filled 2016-12-29: qty 1

## 2016-12-29 MED ORDER — LIDOCAINE 2% (20 MG/ML) 5 ML SYRINGE
INTRAMUSCULAR | Status: AC
  Filled 2016-12-29: qty 5

## 2016-12-29 MED ORDER — MIDAZOLAM HCL 5 MG/5ML IJ SOLN
INTRAMUSCULAR | Status: DC | PRN
  Administered 2016-12-29: 2 mg via INTRAVENOUS

## 2016-12-29 MED ORDER — OXYCODONE HCL 5 MG PO TABS: 5.0000 mg | ORAL_TABLET | Freq: Once | ORAL | Status: DC | PRN

## 2016-12-29 MED ORDER — ONDANSETRON 4 MG PO TBDP: 4.0000 mg | ORAL_TABLET | Freq: Four times a day (QID) | ORAL | Status: DC | PRN

## 2016-12-29 MED ORDER — ONDANSETRON HCL 4 MG/2ML IJ SOLN
INTRAMUSCULAR | Status: DC | PRN
  Administered 2016-12-29: 4 mg via INTRAVENOUS

## 2016-12-29 MED ORDER — FENTANYL CITRATE (PF) 100 MCG/2ML IJ SOLN
INTRAMUSCULAR | Status: DC | PRN
  Administered 2016-12-29 (×2): 50 ug via INTRAVENOUS

## 2016-12-29 MED ORDER — DEXAMETHASONE SODIUM PHOSPHATE 10 MG/ML IJ SOLN
INTRAMUSCULAR | Status: DC | PRN
  Administered 2016-12-29: 10 mg via INTRAVENOUS

## 2016-12-29 MED ORDER — ROCURONIUM BROMIDE 10 MG/ML (PF) SYRINGE
PREFILLED_SYRINGE | INTRAVENOUS | Status: DC | PRN
  Administered 2016-12-29 (×2): 10 mg via INTRAVENOUS
  Administered 2016-12-29: 30 mg via INTRAVENOUS

## 2016-12-29 MED ORDER — TISSEEL VH 10 ML EX KIT
PACK | CUTANEOUS | Status: AC
  Filled 2016-12-29: qty 1

## 2016-12-29 MED ORDER — MIDAZOLAM HCL 2 MG/2ML IJ SOLN
INTRAMUSCULAR | Status: AC
  Filled 2016-12-29: qty 2

## 2016-12-29 MED ORDER — OXYCODONE HCL 5 MG/5ML PO SOLN: 5.0000 mg | Freq: Once | ORAL | Status: DC | PRN

## 2016-12-29 MED ORDER — PROPOFOL 10 MG/ML IV BOLUS
INTRAVENOUS | Status: AC
  Filled 2016-12-29: qty 20

## 2016-12-29 MED ORDER — FENTANYL CITRATE (PF) 100 MCG/2ML IJ SOLN: 25.0000 ug | INTRAMUSCULAR | Status: DC | PRN

## 2016-12-29 MED ORDER — ONDANSETRON HCL 4 MG/2ML IJ SOLN
INTRAMUSCULAR | Status: AC
  Filled 2016-12-29: qty 2

## 2016-12-29 MED ORDER — CEFAZOLIN SODIUM-DEXTROSE 2-4 GM/100ML-% IV SOLN
2.0000 g | INTRAVENOUS | Status: AC
  Administered 2016-12-29: 2 g via INTRAVENOUS
  Filled 2016-12-29: qty 100

## 2016-12-29 MED ORDER — SUGAMMADEX SODIUM 200 MG/2ML IV SOLN
INTRAVENOUS | Status: DC | PRN
  Administered 2016-12-29: 200 mg via INTRAVENOUS

## 2016-12-29 MED ORDER — DEXAMETHASONE SODIUM PHOSPHATE 10 MG/ML IJ SOLN
INTRAMUSCULAR | Status: AC
  Filled 2016-12-29: qty 1

## 2016-12-29 MED ORDER — EPHEDRINE SULFATE 50 MG/ML IJ SOLN
INTRAMUSCULAR | Status: DC | PRN
  Administered 2016-12-29 (×3): 10 mg via INTRAVENOUS
  Administered 2016-12-29: 5 mg via INTRAVENOUS

## 2016-12-29 MED ORDER — CELECOXIB 200 MG PO CAPS
400.0000 mg | ORAL_CAPSULE | ORAL | Status: AC
  Administered 2016-12-29: 400 mg via ORAL
  Filled 2016-12-29: qty 2

## 2016-12-29 MED ORDER — CELECOXIB 200 MG PO CAPS
200.0000 mg | ORAL_CAPSULE | Freq: Two times a day (BID) | ORAL | Status: DC
  Administered 2016-12-29 – 2016-12-30 (×2): 200 mg via ORAL
  Filled 2016-12-29 (×2): qty 1

## 2016-12-29 MED ORDER — FENTANYL CITRATE (PF) 100 MCG/2ML IJ SOLN
25.0000 ug | INTRAMUSCULAR | Status: DC | PRN
  Administered 2016-12-29 (×3): 50 ug via INTRAVENOUS

## 2016-12-29 MED ORDER — PROPOFOL 10 MG/ML IV BOLUS
INTRAVENOUS | Status: DC | PRN
  Administered 2016-12-29: 200 mg via INTRAVENOUS

## 2016-12-29 MED ORDER — LIDOCAINE 2% (20 MG/ML) 5 ML SYRINGE
INTRAMUSCULAR | Status: DC | PRN
  Administered 2016-12-29: 100 mg via INTRAVENOUS

## 2016-12-29 MED ORDER — ROCURONIUM BROMIDE 50 MG/5ML IV SOSY
PREFILLED_SYRINGE | INTRAVENOUS | Status: AC
  Filled 2016-12-29: qty 5

## 2016-12-29 MED ORDER — ONDANSETRON HCL 4 MG/2ML IJ SOLN: 4.0000 mg | Freq: Four times a day (QID) | INTRAMUSCULAR | Status: DC | PRN

## 2016-12-29 MED ORDER — 0.9 % SODIUM CHLORIDE (POUR BTL) OPTIME
TOPICAL | Status: DC | PRN
  Administered 2016-12-29: 2000 mL

## 2016-12-29 MED ORDER — SUCCINYLCHOLINE CHLORIDE 200 MG/10ML IV SOSY
PREFILLED_SYRINGE | INTRAVENOUS | Status: DC | PRN
  Administered 2016-12-29: 120 mg via INTRAVENOUS

## 2016-12-29 SURGICAL SUPPLY — 18 items
BINDER ABDOMINAL 12 ML 46-62 (SOFTGOODS) ×3 IMPLANT
DRAPE LAPAROSCOPIC ABDOMINAL (DRAPES) ×3 IMPLANT
DRSG OPSITE POSTOP 4X8 (GAUZE/BANDAGES/DRESSINGS) ×3 IMPLANT
ELECT REM PT RETURN 15FT ADLT (MISCELLANEOUS) ×3 IMPLANT
GLOVE BIO SURGEON STRL SZ7.5 (GLOVE) ×3 IMPLANT
GLOVE BIOGEL PI IND STRL 7.0 (GLOVE) ×1 IMPLANT
GLOVE BIOGEL PI INDICATOR 7.0 (GLOVE) ×2
GOWN STRL REUS W/TWL LRG LVL3 (GOWN DISPOSABLE) ×3 IMPLANT
GOWN STRL REUS W/TWL XL LVL3 (GOWN DISPOSABLE) ×9 IMPLANT
KIT BASIN OR (CUSTOM PROCEDURE TRAY) ×3 IMPLANT
MESH HERNIA 6X6 BARD (Mesh General) ×1 IMPLANT
MESH HERNIA BARD 6X6 (Mesh General) ×2 IMPLANT
PACK GENERAL/GYN (CUSTOM PROCEDURE TRAY) ×3 IMPLANT
SUT PDS AB 1 CTX 36 (SUTURE) ×12 IMPLANT
SUT SILK 3 0 SH CR/8 (SUTURE) ×3 IMPLANT
SYR 20CC LL (SYRINGE) ×3 IMPLANT
TOWEL OR 17X26 10 PK STRL BLUE (TOWEL DISPOSABLE) ×6 IMPLANT
TOWEL OR NON WOVEN STRL DISP B (DISPOSABLE) ×3 IMPLANT

## 2016-12-29 NOTE — Anesthesia Preprocedure Evaluation (Signed)
Anesthesia Evaluation  Patient identified by MRN, date of birth, ID band Patient awake    Reviewed: Allergy & Precautions, NPO status , Patient's Chart, lab work & pertinent test results  History of Anesthesia Complications Negative for: history of anesthetic complications  Airway Mallampati: II  TM Distance: >3 FB Neck ROM: Full    Dental  (+) Upper Dentures, Missing,    Pulmonary neg pulmonary ROS,    breath sounds clear to auscultation       Cardiovascular negative cardio ROS   Rhythm:Regular     Neuro/Psych PSYCHIATRIC DISORDERS Anxiety negative neurological ROS     GI/Hepatic Neg liver ROS, GERD  Medicated and Controlled,  Endo/Other  Morbid obesity  Renal/GU negative Renal ROS     Musculoskeletal negative musculoskeletal ROS (+)   Abdominal   Peds  Hematology negative hematology ROS (+)   Anesthesia Other Findings   Reproductive/Obstetrics                             Anesthesia Physical Anesthesia Plan  ASA: II  Anesthesia Plan: General   Post-op Pain Management:    Induction: Intravenous  PONV Risk Score and Plan: 3 and Ondansetron and Dexamethasone  Airway Management Planned: Oral ETT  Additional Equipment: None  Intra-op Plan:   Post-operative Plan: Extubation in OR  Informed Consent: I have reviewed the patients History and Physical, chart, labs and discussed the procedure including the risks, benefits and alternatives for the proposed anesthesia with the patient or authorized representative who has indicated his/her understanding and acceptance.   Dental advisory given  Plan Discussed with: CRNA and Surgeon  Anesthesia Plan Comments:         Anesthesia Quick Evaluation

## 2016-12-29 NOTE — Anesthesia Postprocedure Evaluation (Signed)
Anesthesia Post Note  Patient: Oletta [facility]arterMaria G [facility]  Procedure(s) Performed: HERNIA REPAIR INCISIONAL (N/A Abdomen) INSERTION OF MESH (N/A Abdomen)     Patient location during evaluation: PACU Anesthesia Type: General and Regional Level of consciousness: awake and alert Pain management: pain level controlled Vital Signs Assessment: post-procedure vital signs reviewed and stable Respiratory status: spontaneous breathing, nonlabored ventilation, respiratory function stable and patient connected to nasal cannula oxygen Cardiovascular status: blood pressure returned to baseline and stable Postop Assessment: no apparent nausea or vomiting Anesthetic complications: no    Last Vitals:  Vitals:   12/29/16 1600 12/29/16 1717  BP: 117/65 (!) 87/61  Pulse: 74 80  Resp: 15 16  Temp: (!) 36.3 C (!) 36.3 C  SpO2: 93% 99%    Last Pain:  Vitals:   12/29/16 1717  TempSrc: Oral  PainSc:                  Walda Hertzog

## 2016-12-29 NOTE — Anesthesia Procedure Notes (Signed)
Anesthesia Regional Block: TAP block   Pre-Anesthetic Checklist: ,, timeout performed, Correct Patient, Correct Site, Correct Laterality, Correct Procedure, Correct Position, risks and benefits discussed, surgical consent, pre-op evaluation,  At surgeon's request and post-op pain management  Laterality: N/A and Right  Prep: chloraprep       Needles:  Injection technique: Single-shot  Needle Type: Echogenic Needle          Additional Needles:   Narrative:  Start time: 12/29/2016 11:10 AM End time: 12/29/2016 11:12 AM Injection made incrementally with aspirations every 5 mL.  Performed by: Personally   Additional Notes: H+P and labs reviewed, risks and benefits discussed with patient, procedure tolerated well without complications

## 2016-12-29 NOTE — Op Note (Signed)
12/29/2016  12:38 PM  PATIENT:  Ana Coleman  56 y.o. female  PRE-OPERATIVE DIAGNOSIS:  incisional hernia  POST-OPERATIVE DIAGNOSIS:  incisional hernia  PROCEDURE:  Procedure(s) with comments: Incarcerated, retrorectus incisional hernia repair-  ERAS PATHWAY INSERTION OF MESH (N/A)  SURGEON:  Surgeon(s) and Role:    * Axel Filleramirez, Ginnifer Creelman, MD - Primary  ANESTHESIA:   local and general  EBL:  15cc   BLOOD ADMINISTERED:none  DRAINS: none   LOCAL MEDICATIONS USED:  NONE  SPECIMEN:  No Specimen  DISPOSITION OF SPECIMEN:  N/A  COUNTS:  YES  TOURNIQUET:  * No tourniquets in log *  DICTATION: .Dragon Dictation After the patient was consented he was taken back to the operating room and placed in the supine position with bilateral SCDs in place. The patient was prepped and draped in usual sterile fashion. Antibiotics were confirmed and timeout was called and all facts verified.  At this time I proceeded to excise the skin scar. This was discarded. I proceeded to use electrocautery to maintain hemostasis and dissection took place in the most superior portion of the wound down to the subcutaneous tissues fat to the anterior fascia. This was incised. The fascia was elevated and 2 Kocher clamps.  The hernia sac and the abdominal cavity were entered bluntly. There appeared to be some omentum within the midline in the superior portion of the wound. This was carefully dissected away from the abdominal wall. The omentum lay in the midline, this was taken down with blunt and sharp dissection circumferentially to the incision. The fascia was incised distal to the hernia approximately 4 cm.  The hernia was seen to be approximately 4-5 cm.  There was a small incarcerated preperitoneal fat and omentum within the hernia sac.  The hernia sacs were excised.  There were minimal interior adhesions.  At this time I proceeded to retract is rectus muscles medially.  At this time the posterior fascia  was then incised.  Using blunt dissection the belly was dissected away from the posterior rectus fascia.  This was done inferiorly and superiorly.  At the midline inferior and superior portions of the linea alba this was taken down from the anterior abdominal wall.  This was done bilaterally.  The area was checked for hemostasis.  At this time the posterior rectus fascia was reapproximated using #1 PDS in a standard running fashion x2.  I proceeded to irrigate out the retrorectus space.  The area was measured and seemed to be approximately 15 x 15 cm in size.  A piece of Bard soft mesh was selected and placed into the retrorectus space.  This fit well and flat.  Tisseel glue was then used to fasten the mesh to the midline fascia.  At this time the anterior fascia and midline were reapproximated using #1 PDS in a standard running fashion x2.  The subcutaneous tissue was irrigated out with sterile saline.  The skin was then reapproximated using 3-0 Monocryl in subcuticular fashion.  The midline wound was then dressed with honeycomb dressing.  The patient tolerated procedure well was taken to the recovery room in stable condition.   PLAN OF CARE: Admit for overnight observation  PATIENT DISPOSITION:  PACU - hemodynamically stable.   Delay start of Pharmacological VTE agent (>24hrs) due to surgical blood loss or risk of bleeding: no

## 2016-12-29 NOTE — Anesthesia Procedure Notes (Signed)
Anesthesia Regional Block: TAP block   Pre-Anesthetic Checklist: ,, timeout performed, Correct Patient, Correct Site, Correct Laterality, Correct Procedure, Correct Position, risks and benefits discussed, surgical consent, pre-op evaluation,  At surgeon's request and post-op pain management  Laterality: N/A and Left  Prep: chloraprep       Needles:  Injection technique: Single-shot  Needle Type: Echogenic Needle          Additional Needles:   Narrative:  Start time: 12/29/2016 10:12 AM End time: 12/29/2016 10:14 AM Injection made incrementally with aspirations every 5 mL.  Performed by: Personally   Additional Notes: H+P and labs reviewed, risks and benefits discussed with patient, procedure tolerated well without complications

## 2016-12-29 NOTE — Anesthesia Procedure Notes (Signed)
Procedure Name: Intubation Date/Time: 12/29/2016 11:10 AM Performed by: Lind Covert, CRNA Pre-anesthesia Checklist: Patient identified Patient Re-evaluated:Patient Re-evaluated prior to induction Oxygen Delivery Method: Circle system utilized Preoxygenation: Pre-oxygenation with 100% oxygen Induction Type: IV induction Ventilation: Mask ventilation without difficulty Laryngoscope Size: Mac and 3 Grade View: Grade I Tube type: Oral Tube size: 7.0 mm Number of attempts: 1 Airway Equipment and Method: Stylet Placement Confirmation: ETT inserted through vocal cords under direct vision,  positive ETCO2 and breath sounds checked- equal and bilateral Secured at: 22 cm Tube secured with: Tape Dental Injury: Teeth and Oropharynx as per pre-operative assessment

## 2016-12-29 NOTE — Transfer of Care (Signed)
Immediate Anesthesia Transfer of Care Note  Patient: Ana Coleman  Procedure(s) Performed: HERNIA REPAIR INCISIONAL (N/A Abdomen) INSERTION OF MESH (N/A Abdomen)  Patient Location: PACU  Anesthesia Type:General  Level of Consciousness: sedated  Airway & Oxygen Therapy: Patient Spontanous Breathing and Patient connected to face mask oxygen  Post-op Assessment: Report given to RN and Post -op Vital signs reviewed and stable  Post vital signs: Reviewed and stable  Last Vitals:  Vitals:   12/29/16 0853  BP: 130/77  Pulse: (!) 57  Resp: 18  Temp: 36.6 C  SpO2: 98%    Last Pain:  Vitals:   12/29/16 0853  TempSrc: Oral         Complications: No apparent anesthesia complications

## 2016-12-29 NOTE — H&P (Signed)
History of Present Illness The patient is a 56 year old female who presents with an incisional hernia. Referred by: Dr. Azalia BilisKevin Campos Chief Complaint: Incisional hernia  Patient is a 56 year old female, Spanish-speaking, with a history of GERD, and ventral hernia.  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 She states she is not sure whether or not this is associated with her liver. She states that approximately 2 years ago she noticed a small bulge to her upper midline incision. She states that over time this has gotten bigger. She states this was likely result from lifting and carrying heavy things. She states that she has had no signs or symptoms of incarceration or strangulation. He states that the pain is acute at times. It is not incapacitating. There are no other modifying factors.  Patient did have a CT scan of the ER which revealed an incisional hernia approximately 7 cm at the neck. This appeared to be incarcerated parts of her colon and omentum. I personally reviewed the CT studies myself.   Past Surgical History  Gallbladder Surgery - Open   Diagnostic Studies History  Colonoscopy  within last year Mammogram  1-3 years ago Pap Smear  1-5 years ago  Allergies  No Known Allergies 09/18/2016  Medication History No Current Medications Medications Reconciled  Social History  Caffeine use  Coffee. No alcohol use  No drug use  Tobacco use  Never smoker.  Family History  Alcohol Abuse  Brother. Arthritis  Father. Cerebrovascular Accident  Brother. Migraine Headache  Brother, Sister.  Pregnancy / Birth History  Age at menarche  14 years. Age of menopause  <44 Gravida  3 Irregular periods  Length (months) of breastfeeding  7-12 Maternal age  57-20 Para  3  Other Problems  Anxiety Disorder   Arthritis  Back Pain  Bladder Problems  Depression  Ventral Hernia Repair     Review of Systems  General Present- Fatigue, Night Sweats and Weight Gain. Not Present- Appetite Loss, Chills, Fever and Weight Loss. Skin Not Present- Change in Wart/Mole, Dryness, Hives, Jaundice, New Lesions, Non-Healing Wounds, Rash and Ulcer. HEENT Present- Earache. Not Present- Hearing Loss, Hoarseness, Nose Bleed, Oral Ulcers, Ringing in the Ears, Seasonal Allergies, Sinus Pain, Sore Throat, Visual Disturbances, Wears glasses/contact lenses and Yellow Eyes. Respiratory Present- Difficulty Breathing, Snoring and Wheezing. Not Present- Bloody sputum and Chronic Cough. Breast Present- Breast Pain. Not Present- Breast Mass, Nipple Discharge and Skin Changes. Cardiovascular Present- Chest Pain, Difficulty Breathing Lying Down, Leg Cramps, Rapid Heart Rate, Shortness of Breath and Swelling of Extremities. Not Present- Palpitations. Gastrointestinal Present- Abdominal Pain, Bloating and Hemorrhoids. Not Present- Bloody Stool, Change in Bowel Habits, Chronic diarrhea, Constipation, Difficulty Swallowing, Excessive gas, Gets full quickly at meals, Indigestion, Nausea, Rectal Pain and Vomiting. Female Genitourinary Present- Frequency, Nocturia and Pelvic Pain. Not Present- Painful Urination and Urgency. Musculoskeletal Present- Back Pain and Muscle Pain. Not Present- Joint Pain, Joint Stiffness, Muscle Weakness and Swelling of Extremities. Neurological Present- Decreased Memory, Headaches, Numbness and Tingling. Not Present- Fainting, Seizures, Tremor, Trouble walking and Weakness. Psychiatric Present- Anxiety, Change in Sleep Pattern, Depression, Fearful and Frequent crying. Not Present- Bipolar. Endocrine Present- Cold Intolerance and Hair Changes. Not Present- Excessive Hunger, Heat Intolerance, Hot flashes and New Diabetes. All other systems negative  BP 130/77   Pulse (!) 57   Temp 97.9 F (36.6 C)  (  Oral)   Resp 18   Ht 5\' 2"  (1.575 m)   Wt 97.1 kg (214 lb)   SpO2 98%   BMI 39.14 kg/m     Physical Exam The physical exam findings are as follows: Note:Constitutional: No acute distress, conversant, appears stated age  Eyes: Anicteric sclerae, moist conjunctiva, no lid lag  Neck: No thyromegaly, trachea midline, no cervical lymphadenopathy  Lungs: Clear to auscultation biilaterally, normal respiratory effot  Cardiovascular: regular rate & rhythm, no murmurs, no peripheal edema, pedal pulses 2+  GI: Soft, no masses or hepatosplenomegaly, non-tender to palpation  MSK: Normal gait, no clubbing cyanosis, edema  Skin: No rashes, palpation reveals normal skin turgor  Psychiatric: Appropriate judgment and insight, oriented to person, place, and time  Abdomen Inspection Hernias - Incisional - Reducible(Upper midline incisional hernia, reducible Long midline incision).    Assessment & Plan INCISIONAL HERNIA, WITHOUT OBSTRUCTION OR GANGRENE (K43.2) Impression: Patient is a 56 year old female with an incisional hernia. 1. The patient like to proceed to the operative for a open incisional hernia repair with mesh 2. All risks and benefits were discussed with the patient to generally include, but not limited to: infection, bleeding, damage to surrounding structures, acute and chronic nerve pain, and recurrence. Alternatives were offered and described. All questions were answered and the patient voiced understanding of the procedure and wishes to proceed at this point with hernia repair.

## 2016-12-30 LAB — BASIC METABOLIC PANEL
Anion gap: 7 (ref 5–15)
BUN: 9 mg/dL (ref 6–20)
CALCIUM: 9.1 mg/dL (ref 8.9–10.3)
CHLORIDE: 106 mmol/L (ref 101–111)
CO2: 23 mmol/L (ref 22–32)
CREATININE: 0.49 mg/dL (ref 0.44–1.00)
GFR calc Af Amer: >60 mL/min (ref >60)
GFR calc non Af Amer: >60 mL/min (ref >60)
Glucose, Bld: 130 mg/dL — ABNORMAL HIGH (ref 65–99)
Potassium: 4.2 mmol/L (ref 3.5–5.1)
SODIUM: 136 mmol/L (ref 135–145)

## 2016-12-30 LAB — CBC
HEMATOCRIT: 41.9 % (ref 36.0–46.0)
Hemoglobin: 14.2 g/dL (ref 12.0–15.0)
MCH: 31.3 pg (ref 26.0–34.0)
MCHC: 33.9 g/dL (ref 30.0–36.0)
MCV: 92.3 fL (ref 78.0–100.0)
PLATELETS: 218 10*3/uL (ref 150–400)
RBC: 4.54 MIL/uL (ref 3.87–5.11)
RDW: 12.3 % (ref 11.5–15.5)
WBC: 18.7 10*3/uL — ABNORMAL HIGH (ref 4.0–10.5)

## 2016-12-30 MED ORDER — HYDROCODONE-ACETAMINOPHEN 5-325 MG PO TABS: 1.0000 | ORAL_TABLET | Freq: Four times a day (QID) | ORAL | 0 refills | Status: DC | PRN

## 2016-12-30 NOTE — Progress Notes (Signed)
Dicussed with patient and family discharge instructions, they all verbalized agreement and understanding.  Patient to go home with all belongings to go home in private vehicle.

## 2016-12-30 NOTE — Discharge Instructions (Signed)
[facility] - DISCHARGE INSTRUCTIONS TO PATIENT  Activity:  Driving - May drive in 4 or 5 days, if doing well.   Lifting - No lifting more that 15 pounds for 4 weeks.  Wound Care:   Leave bandage for 3 days, then may remove bandage and shower  Diet:  As tolerated  Follow up appointment:  Call Dr. Jacinto Halimamirez's office [facility]) at [phone] for an appointment in 2 weeks.  Medications and dosages:  Resume your home medications.  You have a prescription for:  Vicodin  Call Dr. Derrell Lollingamirez or his office  [phone]) if you have:  Temperature greater than 100.4,  Persistent nausea and vomiting,  Severe uncontrolled pain,  Redness, tenderness, or signs of infection (pain, swelling, redness, odor or green/yellow discharge around the site),  Difficulty breathing, headache or visual disturbances,  Any other questions or concerns you may have after discharge.  In an emergency, call 911 or go to an Emergency Department at a nearby hospital.

## 2016-12-30 NOTE — Discharge Summary (Signed)
Physician Discharge Summary  Patient ID:  Ana Coleman  MRN: [id number]  DOB/AGE: Jun 25, 1960 56 y.o.  Admit date: 12/29/2016 Discharge date: 12/30/2016  Discharge Diagnoses:   Active Problems:   S/P hernia repair  Operation: Procedure(s): HERNIA REPAIR INCISIONAL, INSERTION OF MESH on 12/29/2016 Derrell Lolling- Ramirez  Discharged Condition: good  Hospital Course: Ana Coleman is an 56 y.o. female whose primary care physician is Muse, Verdell FaceRochelle D., PA-C and who was admitted 12/29/2016 with a chief complaint of ventral hernia.   She was brought to the operating room on 12/29/2016 and underwent  Open repair of ventral hernia with mesh.  She is now one day post op.  Is taking po's, her pain is controlled and she is ready to go home.  Her DIL, Albin FellingCarla, is at the bedside.   The discharge instructions were reviewed with the patient.  Consults: None  Significant Diagnostic Studies: Results for orders placed or performed during the hospital encounter of 12/29/16  CBC  Result Value Ref Range   WBC 18.7 (H) 4.0 - 10.5 K/uL   RBC 4.54 3.87 - 5.11 MIL/uL   Hemoglobin 14.2 12.0 - 15.0 g/dL   HCT 03.441.9 74.236.0 - 59.546.0 %   MCV 92.3 78.0 - 100.0 fL   MCH 31.3 26.0 - 34.0 pg   MCHC 33.9 30.0 - 36.0 g/dL   RDW 63.812.3 75.611.5 - 43.315.5 %   Platelets 218 150 - 400 K/uL  Basic metabolic panel  Result Value Ref Range   Sodium 136 135 - 145 mmol/L   Potassium 4.2 3.5 - 5.1 mmol/L   Chloride 106 101 - 111 mmol/L   CO2 23 22 - 32 mmol/L   Glucose, Bld 130 (H) 65 - 99 mg/dL   BUN 9 6 - 20 mg/dL   Creatinine, Ser 2.950.49 0.44 - 1.00 mg/dL   Calcium 9.1 8.9 - 18.810.3 mg/dL   GFR calc non Af Amer >60 >60 mL/min   GFR calc Af Amer >60 >60 mL/min   Anion gap 7 5 - 15    No results found.  Discharge Exam:  Vitals:   12/30/16 0333 12/30/16 0626  BP: (!) 96/56 91/60  Pulse:  (!) 58  Resp:  16  Temp:  97.8 F (36.6 C)  SpO2:  97%    General: Hispanic F who is alert and generally healthy appearing.   Lungs: Clear to auscultation and symmetric breath sounds. Heart:  RRR. No murmur or rub. Abdomen: Soft.  Normal bowel sounds. Her dressing is intact and the incision, that I can see, is okay.   Discharge Medications:   Allergies as of 12/30/2016   No Known Allergies     Medication List    TAKE these medications   HYDROcodone-acetaminophen 5-325 MG tablet Commonly known as:  NORCO/VICODIN Take 1-2 tablets by mouth every 6 (six) hours as needed for moderate pain.   ibuprofen 200 MG tablet Commonly known as:  ADVIL,MOTRIN Take 400 mg by mouth every 6 (six) hours as needed.       Disposition: 01-Home or Self Care  Discharge Instructions    Diet - low sodium heart healthy   Complete by:  As directed    Increase activity slowly   Complete by:  As directed        Signed: Ovidio Kinavid Georg Ang, M.D., [facility] Office:  [phone]  12/30/2016, 10:05 AM

## 2017-01-01 ENCOUNTER — Encounter (HOSPITAL_COMMUNITY): Payer: Self-pay | Admitting: General Surgery

## 2017-01-11 ENCOUNTER — Other Ambulatory Visit (HOSPITAL_COMMUNITY): Payer: Self-pay | Admitting: *Deleted

## 2017-01-11 DIAGNOSIS — Z1231 Encounter for screening mammogram for malignant neoplasm of breast: Secondary | ICD-10-CM

## 2017-02-19 ENCOUNTER — Ambulatory Visit (HOSPITAL_COMMUNITY)
Admission: RE | Admit: 2017-02-19 | Discharge: 2017-02-19 | Disposition: A | Discharge status: Home / Self Care | Payer: PRIVATE HEALTH INSURANCE | Source: Ambulatory Visit | Attending: *Deleted | Admitting: *Deleted

## 2017-02-19 ENCOUNTER — Encounter (HOSPITAL_COMMUNITY): Payer: Self-pay

## 2017-02-19 DIAGNOSIS — Z1231 Encounter for screening mammogram for malignant neoplasm of breast: Secondary | ICD-10-CM | POA: Insufficient documentation

## 2018-12-10 ENCOUNTER — Other Ambulatory Visit (HOSPITAL_COMMUNITY): Payer: Self-pay | Admitting: *Deleted

## 2018-12-10 DIAGNOSIS — Z1231 Encounter for screening mammogram for malignant neoplasm of breast: Secondary | ICD-10-CM

## 2019-02-24 ENCOUNTER — Other Ambulatory Visit: Payer: Self-pay

## 2019-02-24 ENCOUNTER — Ambulatory Visit (HOSPITAL_COMMUNITY)
Admission: RE | Admit: 2019-02-24 | Discharge: 2019-02-24 | Disposition: A | Discharge status: Home / Self Care | Payer: PRIVATE HEALTH INSURANCE | Source: Ambulatory Visit | Attending: *Deleted | Admitting: *Deleted

## 2019-02-24 DIAGNOSIS — Z1231 Encounter for screening mammogram for malignant neoplasm of breast: Secondary | ICD-10-CM | POA: Insufficient documentation

## 2019-02-25 ENCOUNTER — Other Ambulatory Visit (HOSPITAL_COMMUNITY): Payer: Self-pay | Admitting: *Deleted

## 2019-02-25 DIAGNOSIS — R921 Mammographic calcification found on diagnostic imaging of breast: Secondary | ICD-10-CM

## 2019-03-11 ENCOUNTER — Ambulatory Visit (HOSPITAL_COMMUNITY)
Admission: RE | Admit: 2019-03-11 | Discharge: 2019-03-11 | Disposition: A | Discharge status: Home / Self Care | Payer: PRIVATE HEALTH INSURANCE | Source: Ambulatory Visit | Attending: *Deleted | Admitting: *Deleted

## 2019-03-11 ENCOUNTER — Other Ambulatory Visit: Payer: Self-pay

## 2019-03-11 ENCOUNTER — Encounter (HOSPITAL_COMMUNITY): Payer: PRIVATE HEALTH INSURANCE

## 2019-03-11 ENCOUNTER — Ambulatory Visit (HOSPITAL_COMMUNITY): Admission: RE | Admit: 2019-03-11 | Payer: PRIVATE HEALTH INSURANCE | Source: Ambulatory Visit

## 2019-03-11 DIAGNOSIS — R921 Mammographic calcification found on diagnostic imaging of breast: Secondary | ICD-10-CM | POA: Diagnosis not present

## 2020-03-02 ENCOUNTER — Other Ambulatory Visit (HOSPITAL_COMMUNITY): Payer: Self-pay | Admitting: *Deleted

## 2020-03-02 DIAGNOSIS — Z1231 Encounter for screening mammogram for malignant neoplasm of breast: Secondary | ICD-10-CM

## 2020-03-11 ENCOUNTER — Other Ambulatory Visit: Payer: Self-pay

## 2020-03-11 ENCOUNTER — Ambulatory Visit (HOSPITAL_COMMUNITY)
Admission: RE | Admit: 2020-03-11 | Discharge: 2020-03-11 | Disposition: A | Discharge status: Home / Self Care | Payer: PRIVATE HEALTH INSURANCE | Source: Ambulatory Visit | Attending: *Deleted | Admitting: *Deleted

## 2020-03-11 DIAGNOSIS — Z1231 Encounter for screening mammogram for malignant neoplasm of breast: Secondary | ICD-10-CM | POA: Insufficient documentation

## 2020-04-15 ENCOUNTER — Other Ambulatory Visit: Payer: Self-pay

## 2020-04-15 ENCOUNTER — Ambulatory Visit (HOSPITAL_COMMUNITY)
Admission: RE | Admit: 2020-04-15 | Discharge: 2020-04-15 | Disposition: A | Discharge status: Home / Self Care | Payer: PRIVATE HEALTH INSURANCE | Source: Ambulatory Visit | Attending: *Deleted | Admitting: *Deleted

## 2020-04-15 DIAGNOSIS — Z1231 Encounter for screening mammogram for malignant neoplasm of breast: Secondary | ICD-10-CM | POA: Insufficient documentation

## 2020-09-26 IMAGING — MG MM DIGITAL DIAGNOSTIC UNILAT*L* W/ TOMO W/ CAD
4 series · 4 of 8 positions shown · non-contrast
Comparison: Previous exam(s).

CLINICAL DATA: 58-year-old female for further evaluations of
anterior LEFT breast calcifications.

EXAM:
DIGITAL DIAGNOSTIC UNILATERAL LEFT MAMMOGRAM WITH CAD AND TOMO

[L ML]
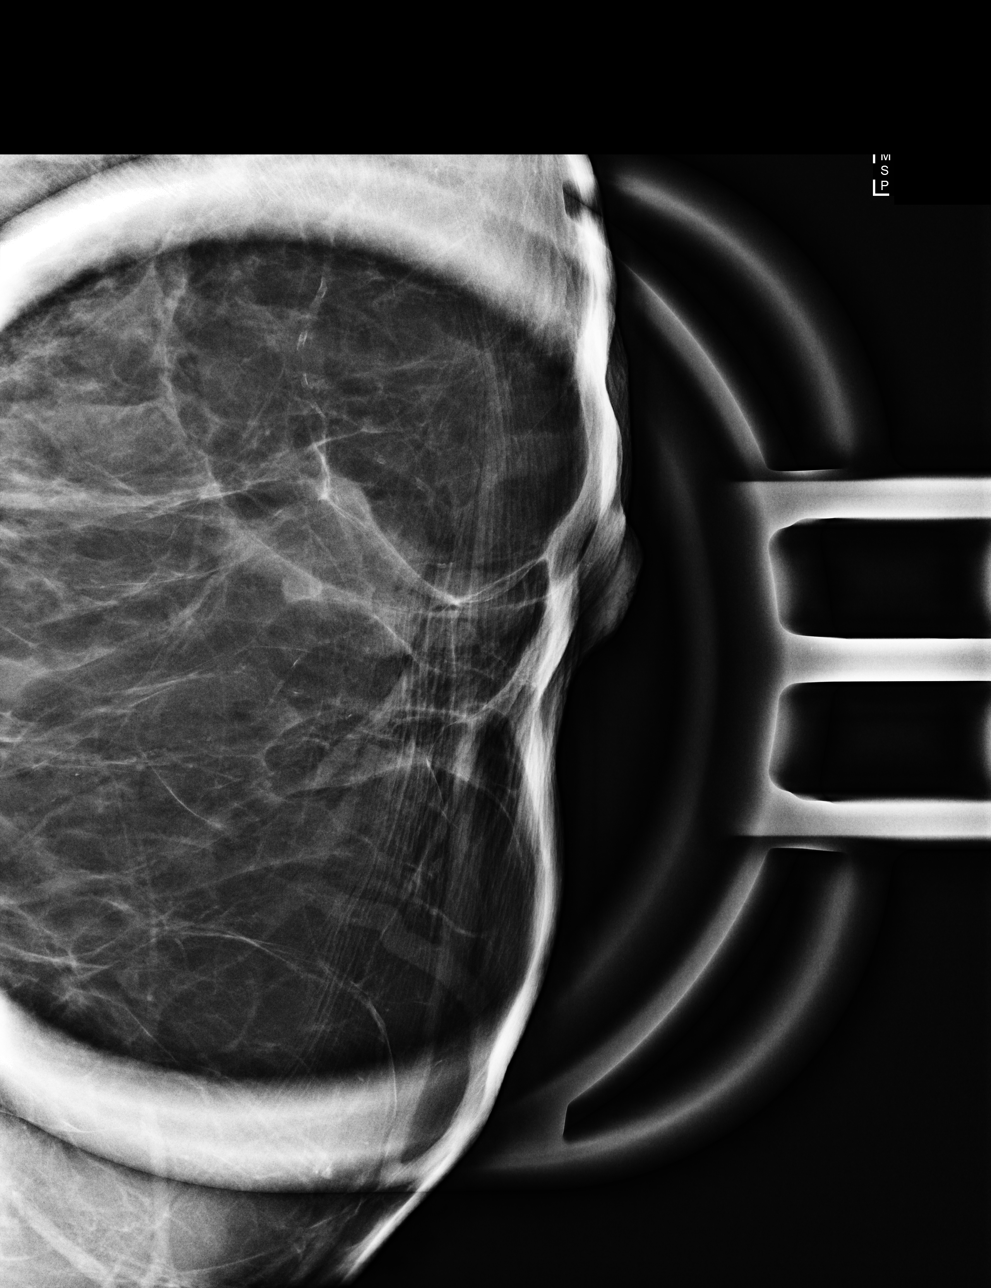

[L CC]
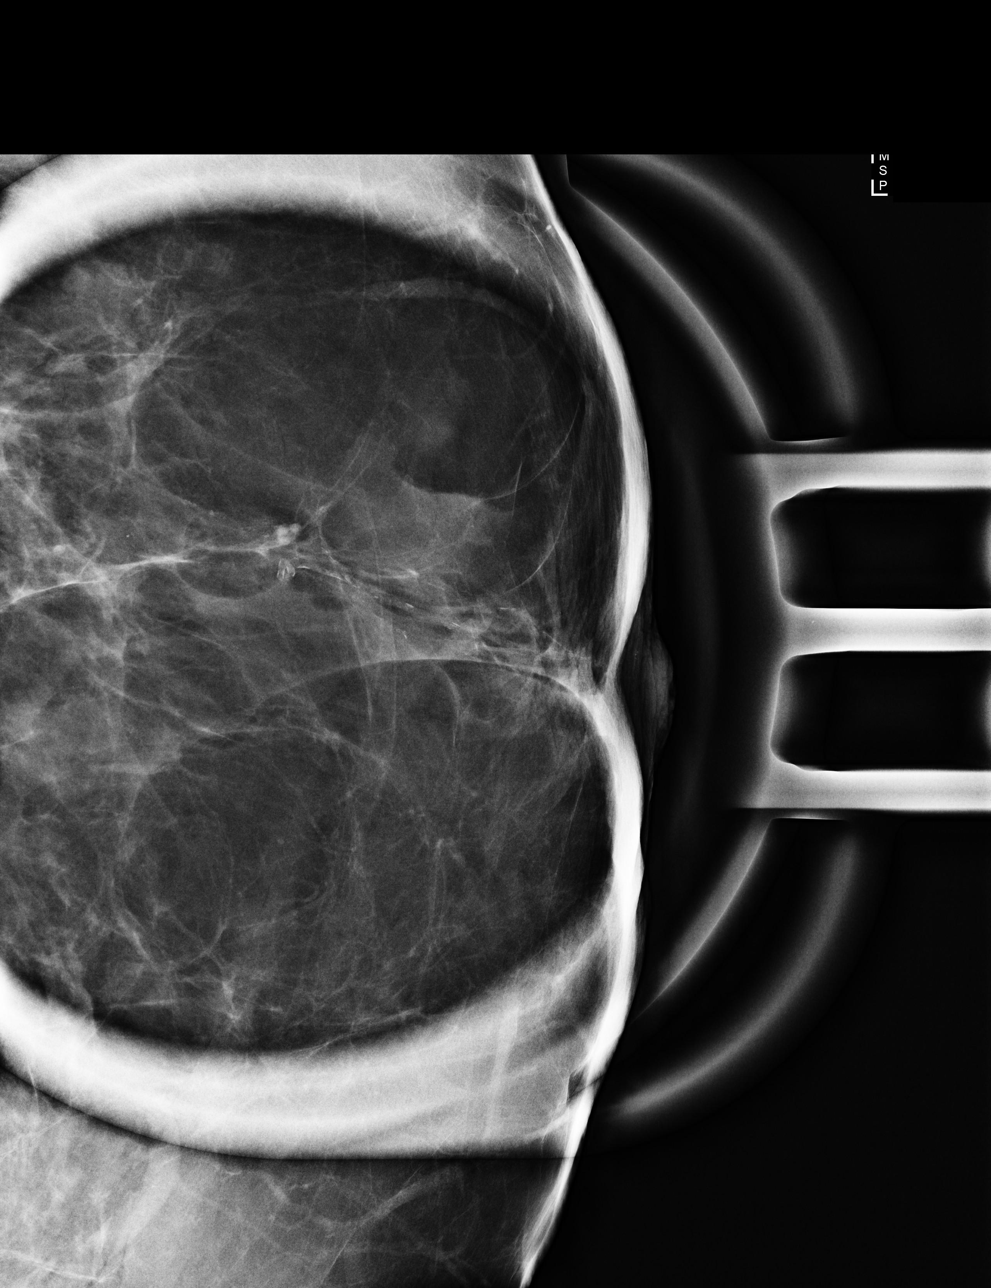

[L ML synth-2D]
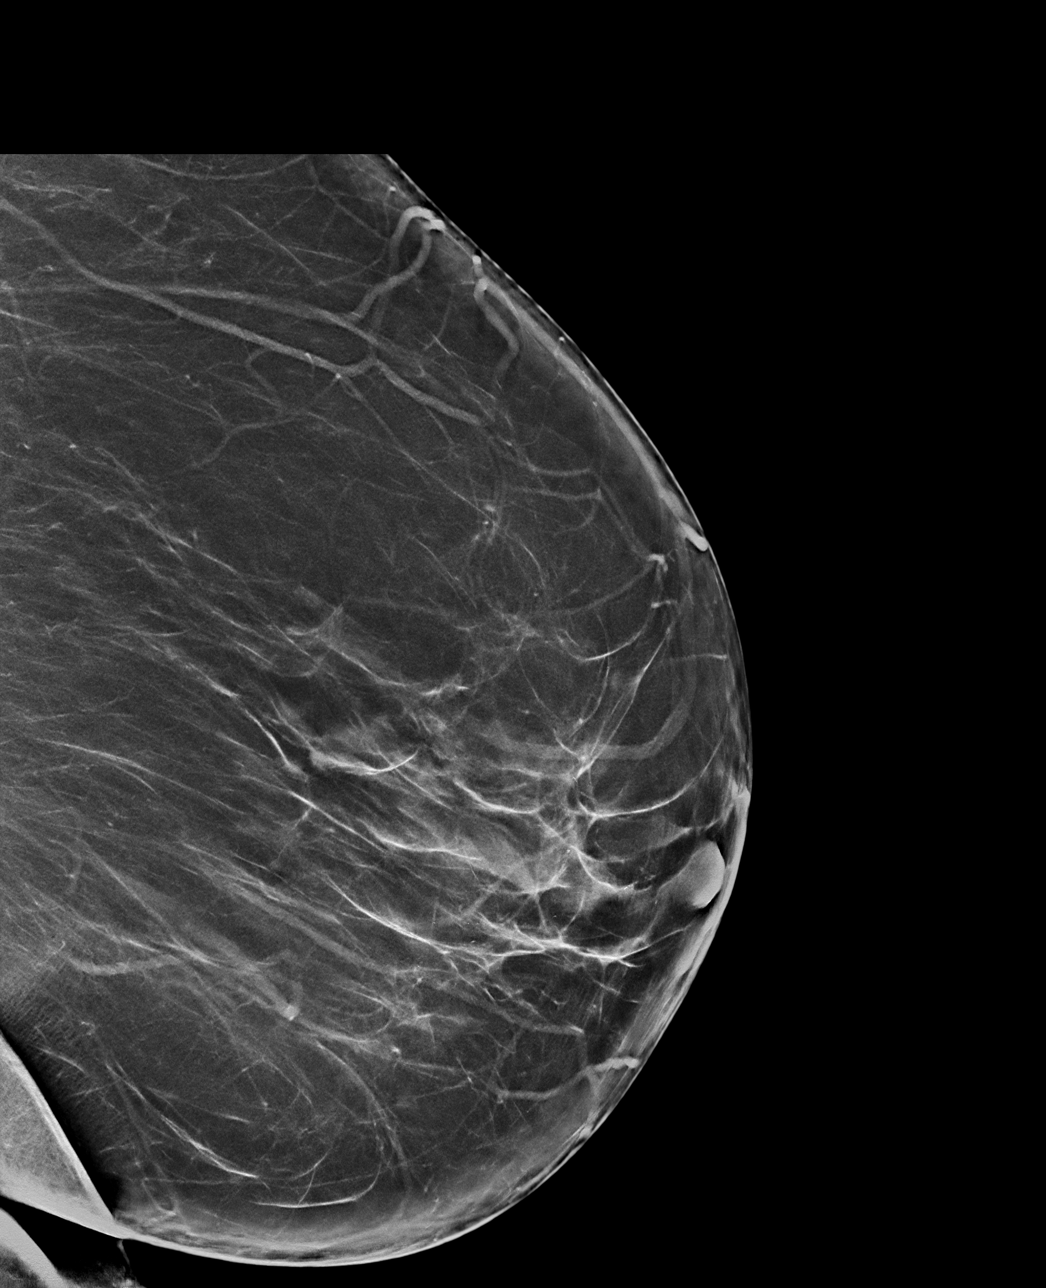

[L ML tomo · tomo slice 41/82.0]
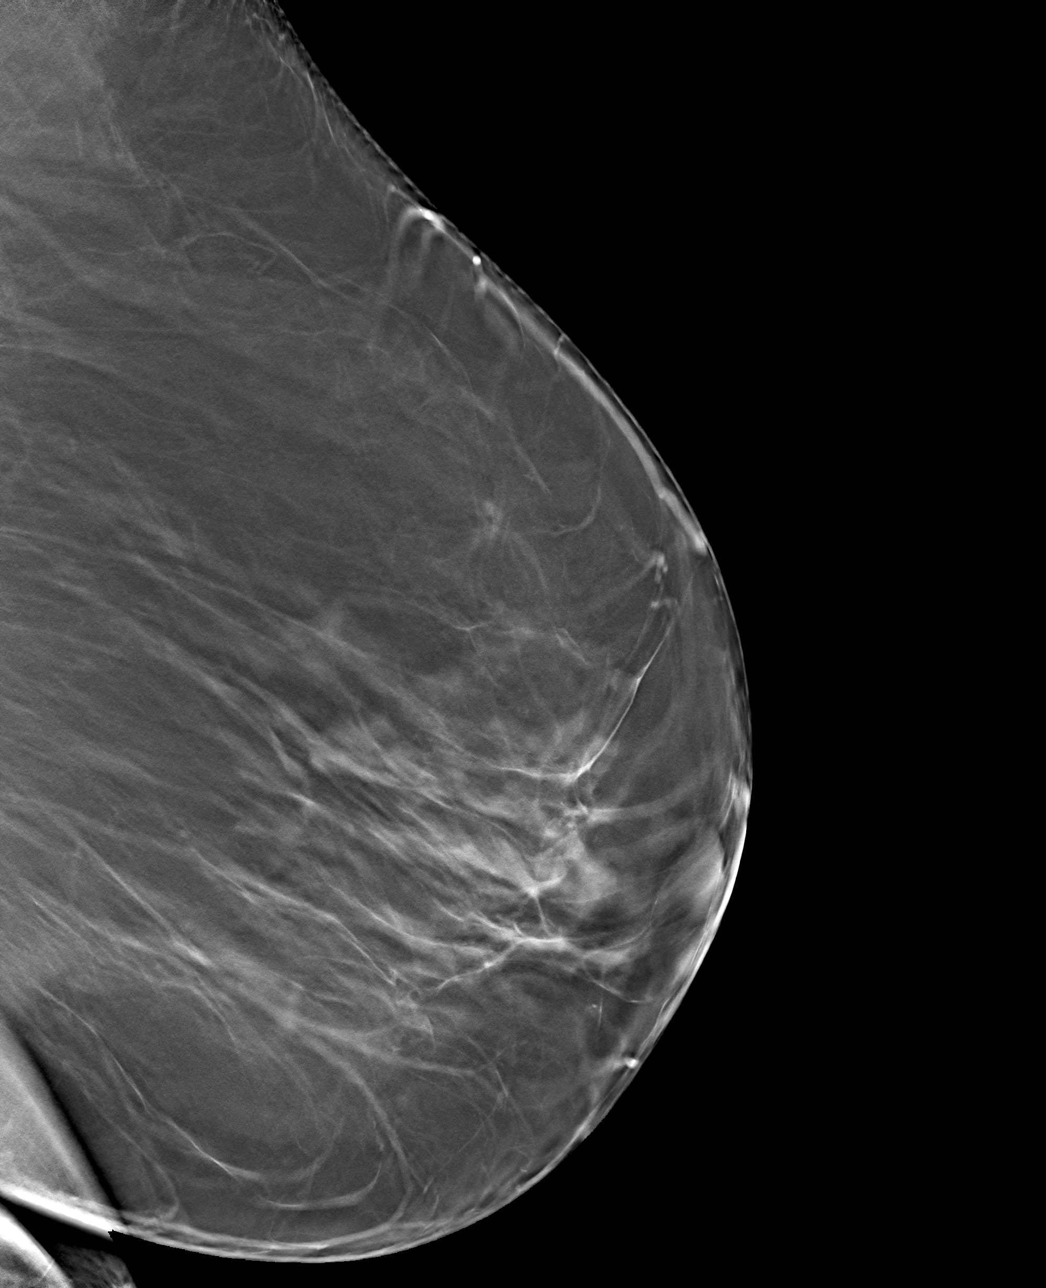

[4 of 8 positions shown; findings below may reference images not displayed]

ACR Breast Density Category b: There are scattered areas of
fibroglandular density.
FINDINGS: 2D/3D full field and magnification views of the LEFT breast
demonstrate the screening study LEFT breast calcifications outlining
vessels and compatible with benign vascular calcifications.

Mammographic images were processed with CAD.
IMPRESSION: Benign vascular calcifications within the anterior LEFT breast.

RECOMMENDATION:
Bilateral screening mammogram in 1 year.

I have discussed the findings and recommendations with the patient.
If applicable, a reminder letter will be sent to the patient
regarding the next appointment.

BI-RADS CATEGORY  2: Benign.

## 2020-12-15 ENCOUNTER — Encounter (HOSPITAL_COMMUNITY): Payer: Self-pay | Admitting: Emergency Medicine

## 2020-12-15 ENCOUNTER — Emergency Department (HOSPITAL_COMMUNITY)
Admission: EM | Admit: 2020-12-15 | Discharge: 2020-12-15 | Disposition: A | Discharge status: Home / Self Care | Payer: Self-pay | Attending: Emergency Medicine | Admitting: Emergency Medicine

## 2020-12-15 DIAGNOSIS — N8110 Cystocele, unspecified: Secondary | ICD-10-CM

## 2020-12-15 DIAGNOSIS — N814 Uterovaginal prolapse, unspecified: Secondary | ICD-10-CM | POA: Insufficient documentation

## 2020-12-15 NOTE — ED Provider Notes (Signed)
[facility] EMERGENCY DEPARTMENT Provider Note   CSN: [id number] Arrival date & time: 12/15/20  9924     History Chief Complaint  Patient presents with   Vaginal Prolapse    Ana Coleman is a 60 y.o. female.  60 year old female presents with family with concern for tissue hanging out of her vagina x4 days.  Patient reported feeling cold yesterday, family checked temperature and found 99.9, given Tylenol.  Patient is afebrile today, no reports of fever greater than 99.9.  Denies dysuria.  States that she has had difficulty fully emptying her bladder for some time now.  Denies pelvic pain.  No other complaints or concerns.      Past Medical History:  Diagnosis Date   Ankle fracture 2016   R ankle   Anxiety    Bronchitis    Hyperlipidemia     Patient Active Problem List   Diagnosis Date Noted   S/P hernia repair 12/29/2016   Gastritis and gastroduodenitis 10/15/2015   Chronic diarrhea 08/20/2015   Abdominal pain, epigastric 08/20/2015   Ventral hernia without obstruction or gangrene 08/20/2015   GERD (gastroesophageal reflux disease) 08/20/2015   Fatty liver 08/20/2015    Past Surgical History:  Procedure Laterality Date   BIOPSY  09/15/2015   Procedure: BIOPSY;  Surgeon: Corbin Ade, MD;  Location: [facility];  Service: Endoscopy;;  gastric & colon   CHOLECYSTECTOMY  2000   found a tumor at the same time   COLONOSCOPY N/A 09/15/2015   Procedure: COLONOSCOPY;  Surgeon: Corbin Ade, MD;  Location: [facility];  Service: Endoscopy;  Laterality: N/A;  730 - interpreter scheduled do NOT move   ESOPHAGOGASTRODUODENOSCOPY N/A 09/15/2015   Procedure: ESOPHAGOGASTRODUODENOSCOPY (EGD);  Surgeon: Corbin Ade, MD;  Location: [facility];  Service: Endoscopy;  Laterality: N/A;   INCISIONAL HERNIA REPAIR N/A 12/29/2016   Procedure: HERNIA REPAIR INCISIONAL;  Surgeon: Axel Filler, MD;  Location: [facility] ORS;  Service: General;  Laterality: N/A;   ERAS  PATHWAY   INSERTION OF MESH N/A 12/29/2016   Procedure: INSERTION OF MESH;  Surgeon: Axel Filler, MD;  Location: [facility] ORS;  Service: General;  Laterality: N/A;   TUBAL LIGATION       OB History   No obstetric history on file.     Family History  Problem Relation Age of Onset   Pancreatic cancer Maternal Grandmother    Colon cancer Neg Hx    Liver disease Neg Hx     Social History   Tobacco Use   Smoking status: Never   Smokeless tobacco: Never  Vaping Use   Vaping Use: Never used  Substance Use Topics   Alcohol use: No    Alcohol/week: 0.0 standard drinks   Drug use: No    Home Medications Prior to Admission medications   Medication Sig Start Date End Date Taking? Authorizing Provider  HYDROcodone-acetaminophen (NORCO/VICODIN) 5-325 MG tablet Take 1-2 tablets by mouth every 6 (six) hours as needed for moderate pain. 12/30/16   Ovidio Kin, MD  ibuprofen (ADVIL,MOTRIN) 200 MG tablet Take 400 mg by mouth every 6 (six) hours as needed.    [provider]    Allergies    Patient has no known allergies.  Review of Systems   Review of Systems  Constitutional:  Positive for chills. Negative for fever.  Respiratory:  Negative for shortness of breath.   Cardiovascular:  Negative for chest pain.  Gastrointestinal:  Negative for abdominal pain, constipation, diarrhea,  nausea and vomiting.  Genitourinary:  Positive for difficulty urinating. Negative for dysuria, pelvic pain and vaginal pain.  Skin:  Negative for wound.  Allergic/Immunologic: Negative for immunocompromised state.  All other systems reviewed and are negative.  Physical Exam Updated Vital Signs BP 107/69 (BP Location: Right Arm)   Pulse 60   Temp 97.9 F (36.6 C) (Oral)   Resp 20   Ht 5\' 1"  (1.549 m)   Wt 96.6 kg   SpO2 96%   BMI 40.25 kg/m   Physical Exam Vitals and nursing note reviewed. Exam conducted with a chaperone present.  Constitutional:      General: She is not in acute  distress.    Appearance: She is well-developed. She is not diaphoretic.  HENT:     Head: Normocephalic and atraumatic.  Cardiovascular:     Pulses: Normal pulses.  Pulmonary:     Effort: Pulmonary effort is normal.  Abdominal:     Palpations: Abdomen is soft.     Tenderness: There is no abdominal tenderness.  Genitourinary:    General: Normal vulva.     Labia:        Right: No tenderness or lesion.        Left: No tenderness or lesion.      Vagina: Prolapsed vaginal walls present.  Musculoskeletal:        General: Normal range of motion.  Skin:    General: Skin is warm and dry.     Findings: No erythema or rash.  Neurological:     Mental Status: She is alert and oriented to person, place, and time.  Psychiatric:        Behavior: Behavior normal.    ED Results / Procedures / Treatments   Labs (all labs ordered are listed, but only abnormal results are displayed) Labs Reviewed - No data to display  EKG None  Radiology No results found.  Procedures Procedures   Medications Ordered in ED Medications - No data to display  ED Course  I have reviewed the triage vital signs and the nursing notes.  Pertinent labs & imaging results that were available during my care of the patient were reviewed by me and considered in my medical decision making (see chart for details).  Clinical Course as of 12/15/20 1021  Wed Dec 15, 2020  [id number] 60 year old female concerned with prolapse with small amount of tissue presents at the vagina. Exam consistent with same. No concerns otherwise. Afebrile today, no UTI symptoms.  Pt scheduled to see PCP Friday, unsure if this person is GYN. Referred to GYN if needed. [LM]    Clinical Course User Index [LM] Wednesday   MDM Rules/Calculators/A&P                           Final Clinical Impression(s) / ED Diagnoses Final diagnoses:  Vaginal prolapse    Rx / DC Orders ED Discharge Orders     None        Alden Hipp,  PA-C 12/15/20 1021    12/17/20, MD 12/15/20 [phone]

## 2020-12-15 NOTE — ED Triage Notes (Signed)
Pt noticed 4 days ago that she has tissue hanging out of vagina. Pt daughter saw it two days ago. Pt has appointment for Friday for physical. Pt ran a fever last night, 99.9 and patient took tylenol. No fever today. Pt not feeling pain, pt feels discomfort with walking. Pt complains of mucus/discharge --clear per patient.

## 2021-01-03 ENCOUNTER — Ambulatory Visit (INDEPENDENT_AMBULATORY_CARE_PROVIDER_SITE_OTHER): Payer: Self-pay | Admitting: Obstetrics & Gynecology

## 2021-01-03 ENCOUNTER — Encounter: Payer: Self-pay | Admitting: Obstetrics & Gynecology

## 2021-01-03 ENCOUNTER — Other Ambulatory Visit: Payer: Self-pay

## 2021-01-03 VITALS — BP 143/95 | HR 55 | Ht 61.0 in | Wt 216.0 lb

## 2021-01-03 DIAGNOSIS — N8110 Cystocele, unspecified: Secondary | ICD-10-CM

## 2021-01-03 DIAGNOSIS — N814 Uterovaginal prolapse, unspecified: Secondary | ICD-10-CM

## 2021-01-03 NOTE — Progress Notes (Signed)
Chief Complaint  Patient presents with   Vaginal Prolapse   New Patient (Initial Visit)      60 y.o. G3P0 No LMP recorded. Patient is postmenopausal. The current method of family planning is post menopausal status.  Outpatient Encounter Medications as of 01/03/2021  Medication Sig   Cholecalciferol (VITAMIN D3) 1.25 MG (50000 UT) TABS Take by mouth.   ibuprofen (ADVIL,MOTRIN) 200 MG tablet Take 400 mg by mouth every 6 (six) hours as needed.   Omega-3 Fatty Acids (FISH OIL) 1000 MG CAPS Take 1 capsule by mouth in the morning, at noon, and at bedtime.   rosuvastatin (CRESTOR) 10 MG tablet Take 10 mg by mouth daily.   [DISCONTINUED] HYDROcodone-acetaminophen (NORCO/VICODIN) 5-325 MG tablet Take 1-2 tablets by mouth every 6 (six) hours as needed for moderate pain.   No facility-administered encounter medications on file as of 01/03/2021.    Subjective Pt with 2-3 month history of pelvic pressure and pain extending up into the groin No bleeding No urinary incontinence +constipation 3 vaginal deliveries Agricultural work, lots of lifting  Past Medical History:  Diagnosis Date   Ankle fracture 2016   R ankle   Anxiety    Bronchitis    History of prediabetes    Hyperlipidemia    Hypothyroidism     Past Surgical History:  Procedure Laterality Date   BIOPSY  09/15/2015   Procedure: BIOPSY;  Surgeon: Corbin Ade, MD;  Location: [facility];  Service: Endoscopy;;  gastric & colon   CHOLECYSTECTOMY  2000   found a tumor at the same time   COLONOSCOPY N/A 09/15/2015   Procedure: COLONOSCOPY;  Surgeon: Corbin Ade, MD;  Location: [facility];  Service: Endoscopy;  Laterality: N/A;  730 - interpreter scheduled do NOT move   ESOPHAGOGASTRODUODENOSCOPY N/A 09/15/2015   Procedure: ESOPHAGOGASTRODUODENOSCOPY (EGD);  Surgeon: Corbin Ade, MD;  Location: [facility];  Service: Endoscopy;  Laterality: N/A;   INCISIONAL HERNIA REPAIR N/A 12/29/2016   Procedure: HERNIA  REPAIR INCISIONAL;  Surgeon: Axel Filler, MD;  Location: [facility];  Service: General;  Laterality: N/A;   ERAS PATHWAY   INSERTION OF MESH N/A 12/29/2016   Procedure: INSERTION OF MESH;  Surgeon: Axel Filler, MD;  Location: [facility];  Service: General;  Laterality: N/A;   TUBAL LIGATION      OB History     Gravida  3   Para      Term      Preterm      AB      Living  3      SAB      IAB      Ectopic      Multiple      Live Births  3           No Known Allergies  Social History   Socioeconomic History   Marital status: Married    Spouse name: Not on file   Number of children: 3   Years of education: Not on file   Highest education level: Not on file  Occupational History   Not on file  Tobacco Use   Smoking status: Never   Smokeless tobacco: Never  Vaping Use   Vaping Use: Never used  Substance and Sexual Activity   Alcohol use: No    Alcohol/week: 0.0 standard drinks   Drug use: No   Sexual activity: Not Currently    Birth control/protection: None  Other Topics  Concern   Not on file  Social History Narrative   Not on file   Social Determinants of Health   Financial Resource Strain: Low Risk    Difficulty of Paying Living Expenses: Not hard at all  Food Insecurity: No Food Insecurity   Worried About Programme researcher, broadcasting/film/video in the Last Year: Never true   Ran Out of Food in the Last Year: Never true  Transportation Needs: No Transportation Needs   Lack of Transportation (Medical): No   Lack of Transportation (Non-Medical): No  Physical Activity: Insufficiently Active   Days of Exercise per Week: 3 days   Minutes of Exercise per Session: 10 min  Stress: No Stress Concern Present   Feeling of Stress : Not at all  Social Connections: Moderately Integrated   Frequency of Communication with Friends and Family: More than three times a week   Frequency of Social Gatherings with Friends and Family: Once a week   Attends Religious Services:  More than 4 times per year   Active Member of [employer] or Organizations: No   Attends Engineer, structural: Never   Marital Status: Married    Family History  Problem Relation Age of Onset   Pancreatic cancer Maternal Grandmother    Hypertension Father    Skin cancer Mother    Hypertension Brother    Colon cancer Neg Hx    Liver disease Neg Hx     Medications:       Current Outpatient Medications:    Cholecalciferol (VITAMIN D3) 1.25 MG (50000 UT) TABS, Take by mouth., Disp: , Rfl:    ibuprofen (ADVIL,MOTRIN) 200 MG tablet, Take 400 mg by mouth every 6 (six) hours as needed., Disp: , Rfl:    Omega-3 Fatty Acids (FISH OIL) 1000 MG CAPS, Take 1 capsule by mouth in the morning, at noon, and at bedtime., Disp: , Rfl:    rosuvastatin (CRESTOR) 10 MG tablet, Take 10 mg by mouth daily., Disp: , Rfl:   Objective Blood pressure (!) 143/95, pulse (!) 55, height 5\' 1"  (1.549 m), weight 216 lb (98 kg).  General [facility]N female NAD Vulva:  normal appearing vulva with no masses, tenderness or lesions Vagina:  normal mucosa, no discharge Grade 2 cystocoele Cervix:  Normal no lesions Uterus:  normal size, contour, position, consistency, mobility, non-tender, Grade 2 relaxation Adnexa: ovaries:present,  normal adnexa in size, nontender and no masses   Pertinent ROS No burning with urination, frequency or urgency No nausea, vomiting or diarrhea Nor fever chills or other constitutional symptoms   Labs or studies No new     Impression Diagnoses this Encounter::   ICD-10-CM   1. POP-Q stage 2 cystocele  N81.10     2. Uterine prolapse, Grade 2  N81.4       Established relevant diagnosis(es):   Plan/Recommendations: No orders of the defined types were placed in this encounter.   Labs or Scans Ordered: No orders of the defined types were placed in this encounter.   Management:: Milex ring with support #4 placed  Follow up Return in about 1 month (around 02/03/2021) for  Follow up, with Dr 04/03/2021.       All questions were answered.

## 2021-02-03 ENCOUNTER — Encounter: Payer: Self-pay | Admitting: Obstetrics & Gynecology

## 2021-02-03 ENCOUNTER — Ambulatory Visit (INDEPENDENT_AMBULATORY_CARE_PROVIDER_SITE_OTHER): Payer: Self-pay | Admitting: Obstetrics & Gynecology

## 2021-02-03 ENCOUNTER — Other Ambulatory Visit: Payer: Self-pay

## 2021-02-03 VITALS — BP 138/90 | HR 60 | Ht 60.0 in | Wt 217.5 lb

## 2021-02-03 DIAGNOSIS — N8110 Cystocele, unspecified: Secondary | ICD-10-CM

## 2021-02-03 DIAGNOSIS — Z4689 Encounter for fitting and adjustment of other specified devices: Secondary | ICD-10-CM

## 2021-02-03 NOTE — Progress Notes (Signed)
Chief Complaint  Patient presents with   Follow-up    On vaginal prolapse    Blood pressure 138/90, pulse 60, height 5' (1.524 m), weight 217 lb 8 oz (98.7 kg).  Ana Coleman presents today for routine follow up related to her pessary.   She uses a Milex ring with support #4 She reports no vaginal discharge and no vaginal bleeding   Likert scale(1 not bothersome -5 very bothersome)  :  1  Exam reveals no undue vaginal mucosal pressure of breakdown, no discharge and no vaginal bleeding.  Vaginal Epithelial Abnormality Classification System:   0 0    No abnormalities 1    Epithelial erythema 2    Granulation tissue 3    Epithelial break or erosion, 1 cm or less 4    Epithelial break or erosion, 1 cm or greater  The pessary is removed, cleaned and replaced without difficulty.      ICD-10-CM   1. Pessary maintenance, milex ring with support #4, OF 12/22  Z46.89     2. POP-Q stage 2 cystocele  N81.10        [name] will be sen back in 3 months for continued follow up.  Lazaro Arms, MD  02/03/2021 11:16 AM

## 2021-05-05 ENCOUNTER — Ambulatory Visit (INDEPENDENT_AMBULATORY_CARE_PROVIDER_SITE_OTHER): Payer: Self-pay | Admitting: Obstetrics & Gynecology

## 2021-05-05 ENCOUNTER — Encounter: Payer: Self-pay | Admitting: Obstetrics & Gynecology

## 2021-05-05 VITALS — BP 122/78 | HR 67 | Ht 60.0 in | Wt 222.0 lb

## 2021-05-05 DIAGNOSIS — B9689 Other specified bacterial agents as the cause of diseases classified elsewhere: Secondary | ICD-10-CM

## 2021-05-05 DIAGNOSIS — N8110 Cystocele, unspecified: Secondary | ICD-10-CM

## 2021-05-05 DIAGNOSIS — Z4689 Encounter for fitting and adjustment of other specified devices: Secondary | ICD-10-CM

## 2021-05-05 DIAGNOSIS — N760 Acute vaginitis: Secondary | ICD-10-CM

## 2021-05-05 MED ORDER — METRONIDAZOLE 0.75 % VA GEL: VAGINAL | 0 refills | Status: DC

## 2021-05-05 NOTE — Progress Notes (Signed)
Chief Complaint  ?Patient presents with  ? Pessary Check  ? ? ?Blood pressure 122/78, pulse 67, height 5' (1.524 m), weight 222 lb (100.7 kg). ? ?Ana Coleman presents today for routine follow up related to her pessary.   ?She uses a Milex ring with support #4 ?She reports little vaginal discharge and no vaginal bleeding  ? ?Likert scale(1 not bothersome -5 very bothersome)  :  2 ? ?Exam reveals no undue vaginal mucosal pressure of breakdown, no discharge and no vaginal bleeding. ? ?Vaginal Epithelial Abnormality Classification System:   0 ?0    No abnormalities ?1    Epithelial erythema ?2    Granulation tissue ?3    Epithelial break or erosion, 1 cm or less ?4    Epithelial break or erosion, 1 cm or greater ? ?The pessary is removed, cleaned and replaced without difficulty.   ? ?  ICD-10-CM   ?1. Pessary maintenance, milex ring with support #4, OF 12/22  Z46.89   ?  ?2. POP-Q stage 2 cystocele  N81.10   ?  ?3. BV (bacterial vaginosis)  N76.0   ? B96.89   ?  ?  ? ?Ana Coleman will be sen back in 4 months for continued follow up. + yearly ? ?Lazaro Arms, MD  ?05/05/2021 ?9:23 AM ? ? ? ?

## 2021-05-10 ENCOUNTER — Other Ambulatory Visit: Payer: Self-pay | Admitting: Obstetrics & Gynecology

## 2021-09-01 ENCOUNTER — Ambulatory Visit: Payer: Self-pay | Admitting: Obstetrics & Gynecology

## 2021-09-05 ENCOUNTER — Ambulatory Visit: Payer: Self-pay | Admitting: Obstetrics & Gynecology

## 2021-10-07 ENCOUNTER — Other Ambulatory Visit (HOSPITAL_COMMUNITY)
Admission: RE | Admit: 2021-10-07 | Discharge: 2021-10-07 | Disposition: A | Discharge status: Home / Self Care | Payer: Self-pay | Source: Ambulatory Visit | Attending: Obstetrics & Gynecology | Admitting: Obstetrics & Gynecology

## 2021-10-07 ENCOUNTER — Encounter: Payer: Self-pay | Admitting: Obstetrics & Gynecology

## 2021-10-07 ENCOUNTER — Ambulatory Visit (INDEPENDENT_AMBULATORY_CARE_PROVIDER_SITE_OTHER): Payer: Self-pay | Admitting: Obstetrics & Gynecology

## 2021-10-07 VITALS — BP 146/88 | HR 60 | Ht 61.0 in | Wt 215.0 lb

## 2021-10-07 DIAGNOSIS — Z01419 Encounter for gynecological examination (general) (routine) without abnormal findings: Secondary | ICD-10-CM

## 2021-10-07 DIAGNOSIS — Z1231 Encounter for screening mammogram for malignant neoplasm of breast: Secondary | ICD-10-CM

## 2021-10-07 DIAGNOSIS — Z4689 Encounter for fitting and adjustment of other specified devices: Secondary | ICD-10-CM

## 2021-10-07 NOTE — Progress Notes (Signed)
Subjective:     Ana Coleman is a 61 y.o. female here for a routine exam.  No LMP recorded. Patient is postmenopausal. [id number] Birth Control Method:  menopause Menstrual Calendar(currently): amenorrhea  Current complaints: none.   Current acute medical issues:  none   Recent Gynecologic History No LMP recorded. Patient is postmenopausal. Last Pap: today,  normal Last mammogram: 3/22,  normal  Past Medical History:  Diagnosis Date   Ankle fracture 2016   R ankle   Anxiety    Bronchitis    History of prediabetes    Hyperlipidemia    Hypothyroidism     Past Surgical History:  Procedure Laterality Date   BIOPSY  09/15/2015   Procedure: BIOPSY;  Surgeon: Corbin Ade, MD;  [facility]: [facility];  Service: Endoscopy;;  gastric & colon   CHOLECYSTECTOMY  2000   found a tumor at the same time   COLONOSCOPY N/A 09/15/2015   Procedure: COLONOSCOPY;  Surgeon: Corbin Ade, MD;  [facility]: [facility];  Service: Endoscopy;  Laterality: N/A;  730 - interpreter scheduled do NOT move   ESOPHAGOGASTRODUODENOSCOPY N/A 09/15/2015   Procedure: ESOPHAGOGASTRODUODENOSCOPY (EGD);  Surgeon: Corbin Ade, MD;  Location: [facility];  Service: Endoscopy;  Laterality: N/A;   INCISIONAL HERNIA REPAIR N/A 12/29/2016   Procedure: HERNIA REPAIR INCISIONAL;  Surgeon: Axel Filler, MD;  Location: [facility];  Service: General;  Laterality: N/A;   ERAS PATHWAY   INSERTION OF MESH N/A 12/29/2016   Procedure: INSERTION OF MESH;  Surgeon: Axel Filler, MD;  Location: [facility];  Service: General;  Laterality: N/A;   TUBAL LIGATION      OB History     Gravida  3   Para  3   Term  3   Preterm      AB      Living  3      SAB      IAB      Ectopic      Multiple      Live Births  3           Social History   Socioeconomic History   Marital status: Married    Spouse name: Not on file   Number of children: 3   Years of education: Not on file   Highest  education level: Not on file  Occupational History   Not on file  Tobacco Use   Smoking status: Never   Smokeless tobacco: Never  Vaping Use   Vaping Use: Never used  Substance and Sexual Activity   Alcohol use: No    Alcohol/week: 0.0 standard drinks of alcohol   Drug use: No   Sexual activity: Not Currently    Birth control/protection: Post-menopausal  Other Topics Concern   Not on file  Social History Narrative   Not on file   Social Determinants of Health   Financial Resource Strain: Low Risk  (01/03/2021)   Overall Financial Resource Strain (CARDIA)    Difficulty of Paying Living Expenses: Not hard at all  Food Insecurity: No Food Insecurity (01/03/2021)   Hunger Vital Sign    Worried About Running Out of Food in the Last Year: Never true    Ran Out of Food in the Last Year: Never true  Transportation Needs: No Transportation Needs (01/03/2021)   PRAPARE - Administrator, Civil Service (Medical): No    Lack of Transportation (Non-Medical): No  Physical Activity: Insufficiently Active (01/03/2021)  Exercise Vital Sign    Days of Exercise per Week: 3 days    Minutes of Exercise per Session: 10 min  Stress: No Stress Concern Present (01/03/2021)   [employer] of Occupational Health - Occupational Stress Questionnaire    Feeling of Stress : Not at all  Social Connections: Moderately Integrated (01/03/2021)   Social Connection and Isolation Panel [NHANES]    Frequency of Communication with Friends and Family: More than three times a week    Frequency of Social Gatherings with Friends and Family: Once a week    Attends Religious Services: More than 4 times per year    Active Member of [employer] or Organizations: No    Attends Engineer, structural: Never    Marital Status: Married    Family History  Problem Relation Age of Onset   Pancreatic cancer Maternal Grandmother    Hypertension Father    Skin cancer Mother    Hypertension Brother    Colon  cancer Neg Hx    Liver disease Neg Hx      Current Outpatient Medications:    Cholecalciferol (VITAMIN D3) 1.25 MG (50000 UT) TABS, Take by mouth., Disp: , Rfl:    ibuprofen (ADVIL,MOTRIN) 200 MG tablet, Take 400 mg by mouth every 6 (six) hours as needed., Disp: , Rfl:    metroNIDAZOLE (METROGEL VAGINAL) 0.75 % vaginal gel, Nightly x 5 nights, Disp: 70 g, Rfl: 0   Omega-3 Fatty Acids (FISH OIL) 1000 MG CAPS, Take 1 capsule by mouth in the morning, at noon, and at bedtime., Disp: , Rfl:    rosuvastatin (CRESTOR) 10 MG tablet, Take 10 mg by mouth daily., Disp: , Rfl:   Review of Systems  Review of Systems  Constitutional: Negative for fever, chills, weight loss, malaise/fatigue and diaphoresis.  HENT: Negative for hearing loss, ear pain, nosebleeds, congestion, sore throat, neck pain, tinnitus and ear discharge.   Eyes: Negative for blurred vision, double vision, photophobia, pain, discharge and redness.  Respiratory: Negative for cough, hemoptysis, sputum production, shortness of breath, wheezing and stridor.   Cardiovascular: Negative for chest pain, palpitations, orthopnea, claudication, leg swelling and PND.  Gastrointestinal: negative for abdominal pain. Negative for heartburn, nausea, vomiting, diarrhea, constipation, blood in stool and melena.  Genitourinary: Negative for dysuria, urgency, frequency, hematuria and flank pain.  Musculoskeletal: Negative for myalgias, back pain, joint pain and falls.  Skin: Negative for itching and rash.  Neurological: Negative for dizziness, tingling, tremors, sensory change, speech change, focal weakness, seizures, loss of consciousness, weakness and headaches.  Endo/Heme/Allergies: Negative for environmental allergies and polydipsia. Does not bruise/bleed easily.  Psychiatric/Behavioral: Negative for depression, suicidal ideas, hallucinations, memory loss and substance abuse. The patient is not nervous/anxious and does not have insomnia.         Objective:  Blood pressure (!) 146/88, pulse 60, height 5\' 1"  (1.549 m), weight 215 lb (97.5 kg).   Physical Exam  Vitals reviewed. Constitutional: She is oriented to person, place, and time. She appears well-developed and well-nourished.  HENT:  Head: Normocephalic and atraumatic.        Right Ear: External ear normal.  Left Ear: External ear normal.  Nose: Nose normal.  Mouth/Throat: Oropharynx is clear and moist.  Eyes: Conjunctivae and EOM are normal. Pupils are equal, round, and reactive to light. Right eye exhibits no discharge. Left eye exhibits no discharge. No scleral icterus.  Neck: Normal range of motion. Neck supple. No tracheal deviation present. No thyromegaly present.  Cardiovascular: Normal  rate, regular rhythm, normal heart sounds and intact distal pulses.  Exam reveals no gallop and no friction rub.   No murmur heard. Respiratory: Effort normal and breath sounds normal. No respiratory distress. She has no wheezes. She has no rales. She exhibits no tenderness.  GI: Soft. Bowel sounds are normal. She exhibits no distension and no mass. There is no tenderness. There is no rebound and no guarding.  Genitourinary:  Breasts no masses skin changes or nipple changes bilaterally      Vulva is normal without lesions Vagina is pink moist without discharge Cervix normal in appearance and pap is done Uterus is normal size shape and contour Adnexa is negative with normal sized ovaries   Pessary removed cleaned and replaced  No complaints with pessary  Musculoskeletal: Normal range of motion. She exhibits no edema and no tenderness.  Neurological: She is alert and oriented to person, place, and time. She has normal reflexes. She displays normal reflexes. No cranial nerve deficit. She exhibits normal muscle tone. Coordination normal.  Skin: Skin is warm and dry. No rash noted. No erythema. No pallor.  Psychiatric: She has a normal mood and affect. Her behavior is normal. Judgment  and thought content normal.       Medications Ordered at today's visit: No orders of the defined types were placed in this encounter.   Other orders placed at today's visit: No orders of the defined types were placed in this encounter.     Assessment:    Normal Gyn exam.    Plan:    Mammogram ordered. Follow up in: 4 months.     Return in about 4 months (around 02/06/2022) for Follow up, pessary, with Dr Despina Hidden.

## 2021-10-14 LAB — CYTOLOGY - PAP
Comment: NEGATIVE
Diagnosis: NEGATIVE
Diagnosis: REACTIVE
High risk HPV: NEGATIVE

## 2021-10-17 ENCOUNTER — Encounter: Payer: Self-pay | Admitting: Obstetrics & Gynecology

## 2021-11-21 ENCOUNTER — Ambulatory Visit (INDEPENDENT_AMBULATORY_CARE_PROVIDER_SITE_OTHER): Payer: Self-pay | Admitting: Obstetrics & Gynecology

## 2021-11-21 ENCOUNTER — Encounter: Payer: Self-pay | Admitting: Obstetrics & Gynecology

## 2021-11-21 VITALS — BP 116/78 | HR 65 | Ht 61.0 in | Wt 214.0 lb

## 2021-11-21 DIAGNOSIS — B9689 Other specified bacterial agents as the cause of diseases classified elsewhere: Secondary | ICD-10-CM

## 2021-11-21 DIAGNOSIS — N760 Acute vaginitis: Secondary | ICD-10-CM

## 2021-11-21 MED ORDER — METRONIDAZOLE 1 % EX GEL: Freq: Every day | CUTANEOUS | 5 refills | Status: AC

## 2021-11-21 NOTE — Progress Notes (Signed)
Chief Complaint  Patient presents with   Vaginal Bleeding    Abnormal vaginal bleeding x 3 weeks       60 y.o. [id number] No LMP recorded. Patient is postmenopausal. The current method of family planning is menopause.  Outpatient Encounter Medications as of 11/21/2021  Medication Sig   Cholecalciferol (VITAMIN D3) 1.25 MG (50000 UT) TABS Take by mouth.   ibuprofen (ADVIL,MOTRIN) 200 MG tablet Take 400 mg by mouth every 6 (six) hours as needed.   metroNIDAZOLE (METROGEL) 1 % gel Apply topically daily.   Omega-3 Fatty Acids (FISH OIL) 1000 MG CAPS Take 1 capsule by mouth in the morning, at noon, and at bedtime.   rosuvastatin (CRESTOR) 10 MG tablet Take 10 mg by mouth daily.   metroNIDAZOLE (METROGEL VAGINAL) 0.75 % vaginal gel Nightly x 5 nights (Patient not taking: Reported on 11/21/2021)   No facility-administered encounter medications on file as of 11/21/2021.    Subjective Pt with 3 weeks of spotting and discharge with associated foul odor No pain No bowel or bladder issues Past Medical History:  Diagnosis Date   Ankle fracture 2016   R ankle   Anxiety    Bronchitis    History of prediabetes    Hyperlipidemia    Hypothyroidism     Past Surgical History:  Procedure Laterality Date   BIOPSY  09/15/2015   Procedure: BIOPSY;  Surgeon: Corbin Ade, MD;  [facility]: [facility];  Service: Endoscopy;;  gastric & colon   CHOLECYSTECTOMY  2000   found a tumor at the same time   COLONOSCOPY N/A 09/15/2015   Procedure: COLONOSCOPY;  Surgeon: Corbin Ade, MD;  [facility];  Service: Endoscopy;  Laterality: N/A;  730 - interpreter scheduled do NOT move   ESOPHAGOGASTRODUODENOSCOPY N/A 09/15/2015   Procedure: ESOPHAGOGASTRODUODENOSCOPY (EGD);  Surgeon: Corbin Ade, MD;  [facility]: [facility];  Service: Endoscopy;  Laterality: N/A;   INCISIONAL HERNIA REPAIR N/A 12/29/2016   Procedure: HERNIA REPAIR INCISIONAL;  Surgeon: Axel Filler, MD;  Location:  [facility];  Service: General;  Laterality: N/A;   ERAS PATHWAY   INSERTION OF MESH N/A 12/29/2016   Procedure: INSERTION OF MESH;  Surgeon: Axel Filler, MD;  Location: [facility];  Service: General;  Laterality: N/A;   TUBAL LIGATION      OB History     Gravida  3   Para  3   Term  3   Preterm      AB      Living  3      SAB      IAB      Ectopic      Multiple      Live Births  3           No Known Allergies  Social History   Socioeconomic History   Marital status: Married    Spouse name: Not on file   Number of children: 3   Years of education: Not on file   Highest education level: Not on file  Occupational History   Not on file  Tobacco Use   Smoking status: Never   Smokeless tobacco: Never  Vaping Use   Vaping Use: Never used  Substance and Sexual Activity   Alcohol use: No    Alcohol/week: 0.0 standard drinks of alcohol   Drug use: No   Sexual activity: Not Currently    Birth control/protection: Post-menopausal  Other Topics Concern  Not on file  Social History Narrative   Not on file   Social Determinants of Health   Financial Resource Strain: Low Risk  (01/03/2021)   Overall Financial Resource Strain (CARDIA)    Difficulty of Paying Living Expenses: Not hard at all  Food Insecurity: No Food Insecurity (01/03/2021)   Hunger Vital Sign    Worried About Running Out of Food in the Last Year: Never true    Ran Out of Food in the Last Year: Never true  Transportation Needs: No Transportation Needs (01/03/2021)   PRAPARE - Administrator, Civil Service (Medical): No    Lack of Transportation (Non-Medical): No  Physical Activity: Insufficiently Active (01/03/2021)   Exercise Vital Sign    Days of Exercise per Week: 3 days    Minutes of Exercise per Session: 10 min  Stress: No Stress Concern Present (01/03/2021)   [employer] of Occupational Health - Occupational Stress Questionnaire    Feeling of Stress : Not at all   Social Connections: Moderately Integrated (01/03/2021)   Social Connection and Isolation Panel [NHANES]    Frequency of Communication with Friends and Family: More than three times a week    Frequency of Social Gatherings with Friends and Family: Once a week    Attends Religious Services: More than 4 times per year    Active Member of [employer] or Organizations: No    Attends Engineer, structural: Never    Marital Status: Married    Family History  Problem Relation Age of Onset   Pancreatic cancer Maternal Grandmother    Hypertension Father    Skin cancer Mother    Hypertension Brother    Colon cancer Neg Hx    Liver disease Neg Hx     Medications:       Current Outpatient Medications:    Cholecalciferol (VITAMIN D3) 1.25 MG (50000 UT) TABS, Take by mouth., Disp: , Rfl:    ibuprofen (ADVIL,MOTRIN) 200 MG tablet, Take 400 mg by mouth every 6 (six) hours as needed., Disp: , Rfl:    metroNIDAZOLE (METROGEL) 1 % gel, Apply topically daily., Disp: 45 g, Rfl: 5   Omega-3 Fatty Acids (FISH OIL) 1000 MG CAPS, Take 1 capsule by mouth in the morning, at noon, and at bedtime., Disp: , Rfl:    rosuvastatin (CRESTOR) 10 MG tablet, Take 10 mg by mouth daily., Disp: , Rfl:    metroNIDAZOLE (METROGEL VAGINAL) 0.75 % vaginal gel, Nightly x 5 nights (Patient not taking: Reported on 11/21/2021), Disp: 70 g, Rfl: 0  Objective Blood pressure 116/78, pulse 65, height 5\' 1"  (1.549 m), weight 214 lb (97.1 kg).  General [facility]DWN female NAD Vulva:  normal appearing vulva with no masses, tenderness or lesions Vagina:  normal mucosa, some discharge no eptheleal injury or erythema Cervix:  Normal no lesions Uterus:  normal size, contour, position, consistency, mobility, non-tender Adnexa: ovaries:present,  normal adnexa in size, nontender and no masses   Pertinent ROS No burning with urination, frequency or urgency No nausea, vomiting or diarrhea Nor fever chills or other constitutional  symptoms   Labs or studies     Impression + Management Plan: Diagnoses this Encounter::   ICD-10-CM   1. BV (bacterial vaginosis) related to pessary  N76.0    B96.89    Metrogel x 5hs        Medications prescribed during  this encounter: Meds ordered this encounter  Medications   metroNIDAZOLE (METROGEL) 1 % gel  Sig: Apply topically daily.    Dispense:  45 g    Refill:  5    Labs or Scans Ordered during this encounter: No orders of the defined types were placed in this encounter.     Follow up Return for keep scheduled.

## 2022-02-06 ENCOUNTER — Ambulatory Visit: Payer: Self-pay | Admitting: Obstetrics & Gynecology

## 2022-02-13 ENCOUNTER — Ambulatory Visit (INDEPENDENT_AMBULATORY_CARE_PROVIDER_SITE_OTHER): Payer: Self-pay | Admitting: Obstetrics & Gynecology

## 2022-02-13 ENCOUNTER — Encounter: Payer: Self-pay | Admitting: Obstetrics & Gynecology

## 2022-02-13 VITALS — BP 144/91 | HR 51 | Ht 61.5 in | Wt 216.0 lb

## 2022-02-13 DIAGNOSIS — N8110 Cystocele, unspecified: Secondary | ICD-10-CM

## 2022-02-13 DIAGNOSIS — Z4689 Encounter for fitting and adjustment of other specified devices: Secondary | ICD-10-CM

## 2022-02-13 DIAGNOSIS — N814 Uterovaginal prolapse, unspecified: Secondary | ICD-10-CM

## 2022-02-13 NOTE — Progress Notes (Signed)
Chief Complaint  Patient presents with   Follow-up    Pessary check    Headache    Complains of insomnia and headaches    Blood pressure (!) 144/91, pulse (!) 51, height 5' 1.5" (1.562 m), weight 216 lb (98 kg).  Ana Coleman presents today for routine follow up related to her pessary.   She uses a Milex ring with support #4 She reports no vaginal discharge and little vaginal bleeding   Likert scale(1 not bothersome -5 very bothersome)  :  2  Exam reveals no undue vaginal mucosal pressure of breakdown, no discharge and no vaginal bleeding.  Vaginal Epithelial Abnormality Classification System:   0 0    No abnormalities 1    Epithelial erythema 2    Granulation tissue 3    Epithelial break or erosion, 1 cm or less 4    Epithelial break or erosion, 1 cm or greater  The pessary is removed, cleaned and replaced without difficulty.      ICD-10-CM   1. Pessary maintenance, milex ring with support #4, OF 12/22  Z46.89     2. POP-Q stage 2 cystocele  N81.10     3. Uterine prolapse, Grade 2  N81.4        Ana Coleman will be sen back in 4 months for continued follow up.  Florian Buff, MD  02/13/2022 10:05 AM

## 2022-03-07 ENCOUNTER — Other Ambulatory Visit (HOSPITAL_COMMUNITY): Payer: Self-pay | Admitting: Obstetrics & Gynecology

## 2022-03-07 DIAGNOSIS — Z1231 Encounter for screening mammogram for malignant neoplasm of breast: Secondary | ICD-10-CM

## 2022-03-17 ENCOUNTER — Encounter (HOSPITAL_COMMUNITY): Payer: Self-pay

## 2022-03-17 ENCOUNTER — Ambulatory Visit (HOSPITAL_COMMUNITY)
Admission: RE | Admit: 2022-03-17 | Discharge: 2022-03-17 | Disposition: A | Discharge status: Home / Self Care | Payer: 59 | Source: Ambulatory Visit | Attending: Obstetrics & Gynecology | Admitting: Obstetrics & Gynecology

## 2022-03-17 DIAGNOSIS — Z1231 Encounter for screening mammogram for malignant neoplasm of breast: Secondary | ICD-10-CM | POA: Insufficient documentation

## 2022-06-15 ENCOUNTER — Ambulatory Visit: Payer: Self-pay | Admitting: Obstetrics & Gynecology

## 2022-06-19 ENCOUNTER — Ambulatory Visit: Payer: 59 | Admitting: Obstetrics & Gynecology

## 2022-07-13 ENCOUNTER — Ambulatory Visit: Payer: 59 | Admitting: Obstetrics & Gynecology

## 2022-07-14 ENCOUNTER — Ambulatory Visit (INDEPENDENT_AMBULATORY_CARE_PROVIDER_SITE_OTHER): Payer: Self-pay | Admitting: Obstetrics & Gynecology

## 2022-07-14 ENCOUNTER — Encounter: Payer: Self-pay | Admitting: Obstetrics & Gynecology

## 2022-07-14 VITALS — BP 131/84 | HR 56 | Ht 61.5 in | Wt 219.0 lb

## 2022-07-14 DIAGNOSIS — N814 Uterovaginal prolapse, unspecified: Secondary | ICD-10-CM

## 2022-07-14 DIAGNOSIS — Z4689 Encounter for fitting and adjustment of other specified devices: Secondary | ICD-10-CM

## 2022-07-14 DIAGNOSIS — N8110 Cystocele, unspecified: Secondary | ICD-10-CM

## 2022-07-14 NOTE — Progress Notes (Signed)
Chief Complaint  Patient presents with   Pessary Check    Blood pressure 131/84, pulse (!) 56, height 5' 1.5" (1.562 m), weight 219 lb (99.3 kg).  Ana Coleman presents today for routine follow up related to her pessary.   She uses a Milex ring with support #4 She reports some malodorous vaginal discharge and no vaginal bleeding   Likert scale(1 not bothersome -5 very bothersome)  :  1  Exam reveals no undue vaginal mucosal pressure of breakdown, no discharge and no vaginal bleeding.  Vaginal Epithelial Abnormality Classification System:   1 0    No abnormalities 1    Epithelial erythema 2    Granulation tissue 3    Epithelial break or erosion, 1 cm or less 4    Epithelial break or erosion, 1 cm or greater  The pessary is removed, cleaned and replaced without difficulty.      ICD-10-CM   1. Pessary maintenance, milex ring with support #4, OF 12/22  Z46.89     2. POP-Q stage 2 cystocele  N81.10     3. Uterine prolapse, Grade 2  N81.4       Recommend boric acid suppositories prn for discharge and occasional odor  Ana Coleman will be sen back in 4 months for continued follow up.  Lazaro Arms, MD  07/14/2022 9:35 AM

## 2022-08-02 ENCOUNTER — Ambulatory Visit (HOSPITAL_COMMUNITY)
Admission: RE | Admit: 2022-08-02 | Discharge: 2022-08-02 | Disposition: A | Discharge status: Home / Self Care | Payer: Self-pay | Source: Ambulatory Visit | Attending: *Deleted | Admitting: *Deleted

## 2022-08-02 ENCOUNTER — Other Ambulatory Visit (HOSPITAL_COMMUNITY): Payer: Self-pay | Admitting: *Deleted

## 2022-08-02 DIAGNOSIS — M545 Low back pain, unspecified: Secondary | ICD-10-CM

## 2022-10-20 ENCOUNTER — Ambulatory Visit (INDEPENDENT_AMBULATORY_CARE_PROVIDER_SITE_OTHER): Payer: Self-pay | Admitting: Obstetrics & Gynecology

## 2022-10-20 ENCOUNTER — Encounter: Payer: Self-pay | Admitting: Obstetrics & Gynecology

## 2022-10-20 VITALS — BP 121/80 | HR 56 | Wt 224.0 lb

## 2022-10-20 DIAGNOSIS — N814 Uterovaginal prolapse, unspecified: Secondary | ICD-10-CM

## 2022-10-20 DIAGNOSIS — Z4689 Encounter for fitting and adjustment of other specified devices: Secondary | ICD-10-CM

## 2022-10-20 DIAGNOSIS — N8110 Cystocele, unspecified: Secondary | ICD-10-CM

## 2022-10-20 NOTE — Progress Notes (Signed)
Chief Complaint  Patient presents with   Gynecologic Exam    Pessary maintenance    Blood pressure 121/80, pulse (!) 56, weight 224 lb (101.6 kg).  Ana Coleman presents today for routine follow up related to her pessary.   She uses a Milex ring with support #4 She reports no vaginal discharge and no vaginal bleeding   Likert scale(1 not bothersome -5 very bothersome)  :  1  Exam reveals no undue vaginal mucosal pressure of breakdown, no discharge and no vaginal bleeding.  Vaginal Epithelial Abnormality Classification System:   0 0    No abnormalities 1    Epithelial erythema 2    Granulation tissue 3    Epithelial break or erosion, 1 cm or less 4    Epithelial break or erosion, 1 cm or greater  The pessary is removed, cleaned and replaced without difficulty.      ICD-10-CM   1. Pessary maintenance, milex ring with support #4, OF 12/22  Z46.89     2. POP-Q stage 2 cystocele  N81.10     3. Uterine prolapse, Grade 2  N81.4        SIANN BUTTO will be sen back in 4 months for continued follow up.  Lazaro Arms, MD  10/20/2022 9:03 AM

## 2022-11-07 ENCOUNTER — Telehealth: Payer: Self-pay

## 2022-11-07 NOTE — Telephone Encounter (Signed)
Opened in ERROR

## 2023-01-11 ENCOUNTER — Encounter (HOSPITAL_COMMUNITY): Payer: Self-pay | Admitting: Emergency Medicine

## 2023-01-11 ENCOUNTER — Emergency Department (HOSPITAL_COMMUNITY)
Admission: EM | Admit: 2023-01-11 | Discharge: 2023-01-11 | Disposition: A | Discharge status: Home / Self Care | Payer: Self-pay | Attending: Emergency Medicine | Admitting: Emergency Medicine

## 2023-01-11 ENCOUNTER — Emergency Department (HOSPITAL_COMMUNITY): Payer: Self-pay

## 2023-01-11 ENCOUNTER — Other Ambulatory Visit: Payer: Self-pay

## 2023-01-11 DIAGNOSIS — S83241A Other tear of medial meniscus, current injury, right knee, initial encounter: Secondary | ICD-10-CM

## 2023-01-11 DIAGNOSIS — M25561 Pain in right knee: Secondary | ICD-10-CM | POA: Insufficient documentation

## 2023-01-11 DIAGNOSIS — R609 Edema, unspecified: Secondary | ICD-10-CM

## 2023-01-11 DIAGNOSIS — M7989 Other specified soft tissue disorders: Secondary | ICD-10-CM | POA: Insufficient documentation

## 2023-01-11 LAB — CBC WITH DIFFERENTIAL/PLATELET
Abs Immature Granulocytes: 0.03 10*3/uL (ref 0.00–0.07)
Basophils Absolute: 0.1 10*3/uL (ref 0.0–0.1)
Basophils Relative: 1 %
Eosinophils Absolute: 0.1 10*3/uL (ref 0.0–0.5)
Eosinophils Relative: 2 %
HCT: 43.1 % (ref 36.0–46.0)
Hemoglobin: 14.2 g/dL (ref 12.0–15.0)
Immature Granulocytes: 0 %
Lymphocytes Relative: 36 %
Lymphs Abs: 2.5 10*3/uL (ref 0.7–4.0)
MCH: 30.5 pg (ref 26.0–34.0)
MCHC: 32.9 g/dL (ref 30.0–36.0)
MCV: 92.5 fL (ref 80.0–100.0)
Monocytes Absolute: 0.5 10*3/uL (ref 0.1–1.0)
Monocytes Relative: 7 %
Neutro Abs: 3.7 10*3/uL (ref 1.7–7.7)
Neutrophils Relative %: 54 %
Platelets: 220 10*3/uL (ref 150–400)
RBC: 4.66 MIL/uL (ref 3.87–5.11)
RDW: 12.2 % (ref 11.5–15.5)
WBC: 6.8 10*3/uL (ref 4.0–10.5)
nRBC: 0 % (ref 0.0–0.2)

## 2023-01-11 LAB — BASIC METABOLIC PANEL
Anion gap: 9 (ref 5–15)
BUN: 13 mg/dL (ref 8–23)
CO2: 22 mmol/L (ref 22–32)
Calcium: 9.7 mg/dL (ref 8.9–10.3)
Chloride: 108 mmol/L (ref 98–111)
Creatinine, Ser: 0.55 mg/dL (ref 0.44–1.00)
GFR, Estimated: >60 mL/min (ref >60)
Glucose, Bld: 106 mg/dL — ABNORMAL HIGH (ref 70–99)
Potassium: 4.1 mmol/L (ref 3.5–5.1)
Sodium: 139 mmol/L (ref 135–145)

## 2023-01-11 MED ORDER — OXYCODONE-ACETAMINOPHEN 5-325 MG PO TABS: 1.0000 | ORAL_TABLET | Freq: Four times a day (QID) | ORAL | 0 refills | Status: DC | PRN

## 2023-01-11 MED ORDER — OXYCODONE-ACETAMINOPHEN 5-325 MG PO TABS: 2.0000 | ORAL_TABLET | Freq: Once | ORAL | Status: DC

## 2023-01-11 MED ORDER — OXYCODONE-ACETAMINOPHEN 5-325 MG PO TABS
1.0000 | ORAL_TABLET | Freq: Once | ORAL | Status: AC
  Administered 2023-01-11: 1 via ORAL
  Filled 2023-01-11: qty 1

## 2023-01-11 MED ORDER — OXYCODONE-ACETAMINOPHEN 5-325 MG PO TABS
1.0000 | ORAL_TABLET | Freq: Once | ORAL | Status: DC
  Filled 2023-01-11: qty 1

## 2023-01-11 NOTE — ED Provider Notes (Signed)
[place] EMERGENCY DEPARTMENT AT [facility] Provider Note   CSN: [id number] Arrival date & time: 01/11/23  1439     History  Chief Complaint  Patient presents with   Knee Pain    Ana Coleman is a 62 y.o. female here presenting with right knee pain.  Right knee pain for the last 2 months.  Patient was diagnosed with arthritis and has been on Motrin with minimal relief.  Patient also has right leg swelling for the last 2 months.  Patient had MRI done recently had resulted today that showed meniscus tear.  Patient complained of worsening pain and swelling so was sent in for DVT study.  Denies any chest pain or shortness of breath.  The history is provided by the patient and a caregiver. A language interpreter was used.  Daughter is translating     Home Medications Prior to Admission medications   Medication Sig Start Date End Date Taking? Authorizing Provider  Cholecalciferol (VITAMIN D3) 1.25 MG (50000 UT) TABS Take by mouth.    [provider]  ibuprofen (ADVIL,MOTRIN) 200 MG tablet Take 400 mg by mouth every 6 (six) hours as needed.    [provider]  metroNIDAZOLE (METROGEL VAGINAL) 0.75 % vaginal gel Nightly x 5 nights Patient not taking: Reported on 11/21/2021 05/05/21   Lazaro Arms, MD  metroNIDAZOLE (METROGEL) 1 % gel Apply topically daily. Patient not taking: Reported on 02/13/2022 11/21/21   Lazaro Arms, MD  Omega-3 Fatty Acids (FISH OIL) 1000 MG CAPS Take 1 capsule by mouth in the morning, at noon, and at bedtime.    [provider]  rosuvastatin (CRESTOR) 10 MG tablet Take 10 mg by mouth daily.    [provider]      Allergies    Patient has no known allergies.    Review of Systems   Review of Systems  Musculoskeletal:        Right knee pain  All other systems reviewed and are negative.   Physical Exam Updated Vital Signs BP (!) 160/83   Pulse (!) 57   Temp 97.7 F (36.5 C)   Resp 16   Ht  5' 1.5" (1.562 m)   Wt 101.6 kg   SpO2 98%   BMI 41.64 kg/m  Physical Exam Vitals and nursing note reviewed.  Constitutional:      Appearance: Normal appearance.  HENT:     Head: Normocephalic.     Nose: Nose normal.     Mouth/Throat:     Mouth: Mucous membranes are moist.  Eyes:     Extraocular Movements: Extraocular movements intact.     Pupils: Pupils are equal, round, and reactive to light.  Cardiovascular:     Rate and Rhythm: Normal rate and regular rhythm.  Pulmonary:     Effort: Pulmonary effort is normal.     Breath sounds: Normal breath sounds.  Abdominal:     General: Abdomen is flat.     Palpations: Abdomen is soft.  Musculoskeletal:     Cervical back: Normal range of motion and neck supple.     Comments: Patient has right knee effusion.  Mild diffuse swelling of the right lower extremity.  2+ DP pulse  Skin:    General: Skin is warm.     Capillary Refill: Capillary refill takes less than 2 seconds.  Neurological:     General: No focal deficit present.     Mental Status: She is alert and oriented to  person, place, and time.  Psychiatric:        Mood and Affect: Mood normal.        Behavior: Behavior normal.     ED Results / Procedures / Treatments   Labs (all labs ordered are listed, but only abnormal results are displayed) Labs Reviewed  BASIC METABOLIC PANEL - Abnormal; Notable for the following components:      Result Value   Glucose, Bld 106 (*)    All other components within normal limits  CBC WITH DIFFERENTIAL/PLATELET    EKG None  Radiology VAS [place] LOWER EXTREMITY VENOUS (DVT) (ONLY [facility] & [facility]) Result Date: 01/11/2023  Lower Venous DVT Study Patient Name:  Ana Coleman [facility]  Date of Exam:   01/11/2023 Medical Rec #: [id number]                Accession #:    [id number] Date of Birth: 1960-02-09               Patient Gender: F Patient Age:   21 years Exam Location:  [facility] Procedure:      VAS [place] LOWER EXTREMITY VENOUS (DVT)  Referring Phys: Nehemiah Settle SMALL --------------------------------------------------------------------------------  Indications: Pain, and Edema. Other Indications: Knee pain x 3 months, recent dx of torn meniscus. Limitations: Body habitus and poor ultrasound/tissue interface. Comparison Study: No previous exams Performing Technologist: Jody Hill RVT, RDMS  Examination Guidelines: A complete evaluation includes B-mode imaging, spectral Doppler, color Doppler, and power Doppler as needed of all accessible portions of each vessel. Bilateral testing is considered an integral part of a complete examination. Limited examinations for reoccurring indications may be performed as noted. The reflux portion of the exam is performed with the patient in reverse Trendelenburg.  +---------+---------------+---------+-----------+----------+--------------+ RIGHT    CompressibilityPhasicitySpontaneityPropertiesThrombus Aging +---------+---------------+---------+-----------+----------+--------------+ CFV      Full           Yes      Yes                                 +---------+---------------+---------+-----------+----------+--------------+ SFJ      Full                                                        +---------+---------------+---------+-----------+----------+--------------+ FV Prox  Full           Yes      Yes                                 +---------+---------------+---------+-----------+----------+--------------+ FV Mid   Full           Yes      Yes                                 +---------+---------------+---------+-----------+----------+--------------+ FV DistalFull           Yes      Yes                                 +---------+---------------+---------+-----------+----------+--------------+ PFV      Full                                                        +---------+---------------+---------+-----------+----------+--------------+  POP      Full           Yes       Yes                                 +---------+---------------+---------+-----------+----------+--------------+ PTV      Full                                                        +---------+---------------+---------+-----------+----------+--------------+ PERO     Full                                                        +---------+---------------+---------+-----------+----------+--------------+   +----+---------------+---------+-----------+----------+--------------+ LEFTCompressibilityPhasicitySpontaneityPropertiesThrombus Aging +----+---------------+---------+-----------+----------+--------------+ CFV Full           Yes      Yes                                 +----+---------------+---------+-----------+----------+--------------+    Summary: RIGHT: - There is no evidence of deep vein thrombosis in the lower extremity.  - No cystic structure found in the popliteal fossa.  LEFT: - No evidence of common femoral vein obstruction.   *See table(s) above for measurements and observations. Electronically signed by Carolynn Sayers on 01/11/2023 at 5:15:01 PM.    Final    DG Knee Complete 4 Views Right Result Date: 01/11/2023 CLINICAL DATA:  Right knee pain EXAM: RIGHT KNEE - COMPLETE 4 VIEW COMPARISON:  None Available. FINDINGS: No evidence of fracture, dislocation, or joint effusion. The joint spaces are well-maintained. Radiopaque density adjacent to the medial femoral condyle, likely sequela of remote trauma or chronic repetitive injury. Soft tissues are unremarkable. IMPRESSION: No acute fracture or dislocation. Electronically Signed   By: Jacob Moores M.D.   On: 01/11/2023 16:48    Procedures Procedures    Medications Ordered in ED Medications  oxyCODONE-acetaminophen (PERCOCET/ROXICET) 5-325 MG per tablet 2 tablet (has no administration in time range)  oxyCODONE-acetaminophen (PERCOCET/ROXICET) 5-325 MG per tablet 1 tablet (1 tablet Oral Given 01/11/23 1823)    ED  Course/ Medical Decision Making/ A&P                                 Medical Decision Making Ana Coleman is a 62 y.o. female here presenting with right knee pain and swelling.  PCP concern for DVT.  Plan to get DVT study and labs and x-rays.  9:45 PM I reviewed patient's labs and they were unremarkable.  X-rays were independently reviewed by me and showed no fracture.  DVT study showed no DVT.  Given knee immobilizer.  Will refer to Ortho outpatient.  Will also try knee immobilizer.  Patient states that she does not feel stable for crutches.  I talked to the social worker who will try and get her a walker tomorrow.  Will prescribe some pain medicine   Problems Addressed: Acute medial meniscus tear of right knee, initial encounter: acute illness or injury  Amount and/or Complexity of Data Reviewed Labs: ordered. Decision-making details documented in ED Course. Radiology: ordered and independent interpretation performed. Decision-making details documented in ED Course.  Risk Prescription drug management.    Final Clinical Impression(s) / ED Diagnoses Final diagnoses:  None    Rx / DC Orders ED Discharge Orders          Ordered    For home use only DME 4 wheeled rolling walker with seat        01/11/23 2138              Charlynne Pander, MD 01/11/23 2202

## 2023-01-11 NOTE — Care Management (Addendum)
Transition of Care [facility]) - Emergency Department Mini Assessment   Patient Details  Name: VALECIA HACKERT MRN: [id number] Date of Birth: February 24, 1960  Transition of [facility]) CM/SW Contact:    Lavenia Atlas, RN Phone Number: 01/11/2023, 10:16 PM   Clinical Narrative: Received call and secure chat regarding patient needing DME: 4 wheeled rolling walker. Per chart review patient reports fall in April and right knee pain since September. Patient is currently uninsured, d/t after hours unable to provide DME: RW tonight. TOC leadership is aware and will approve LOG if charity is unavailable. DME orders are in. Will send to day shift NCM to follow up in the am.  TOC will continue to follow for DME coordination during business hours.     ED Mini Assessment: What brought you to the Emergency Department? : Right knee pain since September  Barriers to Discharge: Other (must enter comment) (ED insured needs DME: 4 wheeled rolling walker)  Barrier interventions: coordinating DME for uninsured patient     Interventions which prevented an admission or readmission: Other (must enter comment) (DME coordination)    Patient Contact and Communications        ,                 Admission diagnosis:  R Knee pain Patient Active Problem List   Diagnosis Date Noted   S/P hernia repair 12/29/2016   Gastritis and gastroduodenitis 10/15/2015   Chronic diarrhea 08/20/2015   Abdominal pain, epigastric 08/20/2015   Ventral hernia without obstruction or gangrene 08/20/2015   GERD (gastroesophageal reflux disease) 08/20/2015   Fatty liver 08/20/2015   PCP:  Tylene Fantasia., PA-C Pharmacy:   [facility] [address], [place] - [address] [place] [address] Phone: [phone] Fax: [phone]  [id number] [place], [place] - [address] AT [facility] OF [address] [place] [address] Phone:  [phone] Fax: [phone]

## 2023-01-11 NOTE — Discharge Instructions (Addendum)
As we discussed, you do not have a blood clot in your leg.  However with your meniscus tear I recommend that you wear the knee immobilizer and I have contacted social work to deliver a walker to you.  I have also prescribed pain medicine he can take as prescribed  Please call orthopedic doctor tomorrow to get follow-up and schedule for surgery  Return to ER if you have worsening pain or unable to walk or worsening swelling

## 2023-01-11 NOTE — ED Triage Notes (Signed)
Pt here for R knee pain since September, states that her doctor told her she has a torn meniscus and was sent here for evaluation. Reports difficulty walking. Reports a fall in April, denies any injury since then.

## 2023-01-11 NOTE — Progress Notes (Signed)
Orthopedic Tech Progress Note Patient Details:  Ana Coleman [facility] [birth year] [id number]  Ortho Devices Type of Ortho Device: Knee Immobilizer Ortho Device/Splint Location: RLE Ortho Device/Splint Interventions: Ordered, Application, Adjustment   Post Interventions Patient Tolerated: Well Instructions Provided: Care of device  Ana Coleman 01/11/2023, 9:57 PM

## 2023-01-11 NOTE — ED Provider Triage Note (Signed)
Emergency Medicine Provider Triage Evaluation Note  Ana Coleman , a 62 y.o. female  was evaluated in triage.  Pt complains of R knee painx 3 months. Reports that sent by PCP to r/o DVT b/c diffuse LLE edema. Had MRI today and said she had torn meniscus. Can't bear weight  Review of Systems  Positive: Leg edema Negative: fevers  Physical Exam  There were no vitals taken for this visit. Gen:   Awake, no distress   Resp:  Normal effort  MSK:   Moves extremities without difficulty  Other:  Venous ttp of RLE  Medical Decision Making  Medically screening exam initiated at 2:59 PM.  Appropriate orders placed.  Ana Coleman was informed that the remainder of the evaluation will be completed by another provider, this initial triage assessment does not replace that evaluation, and the importance of remaining in the ED until their evaluation is complete.    Pete Pelt, [place] 01/11/23 1501

## 2023-01-11 NOTE — TOC Initial Note (Signed)
Transition of Care [facility]) - Initial/Assessment Note    Patient Details  Name: Ana Coleman MRN: [id number] Date of Birth: [birth year]  Transition of Care [facility] CM/SW Contact:    Carmina Miller, LCSWA Phone Number: 01/11/2023, 9:59 PM  Clinical Narrative:                  CSW received request from MD that pt needed a a RW, pt has no payor source, unable to secure RW tonight, will try to have one delivered to ger home tomorrow if we can find a charity provider, MD made aware.        Patient Goals and CMS Choice            Expected Discharge Plan and Services                                              Prior Living Arrangements/Services                       Activities of Daily Living      Permission Sought/Granted                  Emotional Assessment              Admission diagnosis:  R Knee pain Patient Active Problem List   Diagnosis Date Noted   S/P hernia repair 12/29/2016   Gastritis and gastroduodenitis 10/15/2015   Chronic diarrhea 08/20/2015   Abdominal pain, epigastric 08/20/2015   Ventral hernia without obstruction or gangrene 08/20/2015   GERD (gastroesophageal reflux disease) 08/20/2015   Fatty liver 08/20/2015   PCP:  Tylene Fantasia PA-C Pharmacy:   [facility] [place] [address], [place] - [address] [place] [address] Phone: [phone] Fax: [phone]     Social Drivers of Health (SDOH) Social History: SDOH Screenings   Food Insecurity: No Food Insecurity (01/03/2021)  Housing: Low Risk  (01/03/2021)  Transportation Needs: No Transportation Needs (01/03/2021)  Alcohol Screen: Low Risk  (01/03/2021)  Depression (PHQ2-9): Low Risk  (01/03/2021)  Financial Resource Strain: Low Risk  (01/03/2021)  Physical Activity: Insufficiently Active (01/03/2021)  Social Connections: Moderately Integrated (01/03/2021)  Stress: No Stress Concern Present (01/03/2021)  Tobacco Use: Low Risk   (01/11/2023)   SDOH Interventions:     Readmission Risk Interventions     No data to display

## 2023-01-12 ENCOUNTER — Telehealth: Payer: Self-pay | Admitting: *Deleted

## 2023-01-12 NOTE — Telephone Encounter (Signed)
RNCM received handoff to provide rollater and have delivered to home today.  RNCM completed LOG for rollater and Jermaine of [employer] accepted referral to deliver DME to pt home.

## 2023-01-18 ENCOUNTER — Telehealth: Payer: Self-pay

## 2023-01-22 ENCOUNTER — Telehealth: Payer: Self-pay

## 2023-01-22 NOTE — Telephone Encounter (Signed)
Made a 2nd attempt for hospital follow up on today, which deemed to be a SUCCESSFUL completed call.  Pt was reached in collaboration with [employer] who assisted with the call.   Pt was not available at time of call, however her contact person listed on file Gavin Pound) is whom I spoke with.  She states her mother is doing well since her ER Visit on 12.12.24 related to knee pain and torn meniscus.   States even though she still has some pain  it is being managed by home exercises and her medication until her upcoming surgery that is scheduled for Feb 07, 2023;  PT LAST PCP [facility] ON 10.22.24. with a next scheduled visit on 02/06/2023  Daughter states from her observances of mom, that she denies any medication or medical needs at this time.   Daughter states that she and her sister law assists the pt with her basic needs at this time until she is able to get her surgery and recover  Daughter states her mom does not have any social determinant health needs of  urgency at this time as it relates to food, transportation or housing or safety/behavioral health per her actual observances   Plan Care Connect  Uninsured [facility] [place] Appointment has been scheduled for (Monday, Jan 29, 2023 at [facility]) for pt to complete her renewal enrollment that is soon to be expired on Jan 2025.     Colloborated with Jonathon Jordan. of care connect enrollment team to forward a complete application by email to daughter Gordy Levan) to assist her mother in completing to help best prepare for the appointment  Pt was advised of all documents that she would need to assist her  mother to bring to her appointment (see fhases schedule for documents being requested)  Text message reminder was sent to both patient's phone and daughters'phone  regarding appointment info  The daughter was grateful for this information and call then ended

## 2023-01-22 NOTE — Telephone Encounter (Signed)
Made first attempt to f/u with pt by phone but pt was unavailable at the time of the call.     This call was in attempt to provide medical case management to ensure pt did not have any additional questions or concerns regarding any post hospital instruction,medical concerns, obtaining her medications and serving as a reminder of any upcoming appointments.    Will attempt to make additional phone contact, if pt does attempt to return call first

## 2023-02-19 ENCOUNTER — Ambulatory Visit: Payer: Self-pay | Admitting: Obstetrics & Gynecology

## 2023-03-13 ENCOUNTER — Ambulatory Visit (INDEPENDENT_AMBULATORY_CARE_PROVIDER_SITE_OTHER): Payer: Self-pay | Admitting: Obstetrics & Gynecology

## 2023-03-13 ENCOUNTER — Encounter: Payer: Self-pay | Admitting: Obstetrics & Gynecology

## 2023-03-13 VITALS — BP 132/81 | HR 62 | Wt 220.0 lb

## 2023-03-13 DIAGNOSIS — Z4689 Encounter for fitting and adjustment of other specified devices: Secondary | ICD-10-CM

## 2023-03-13 DIAGNOSIS — N8110 Cystocele, unspecified: Secondary | ICD-10-CM

## 2023-03-13 DIAGNOSIS — N814 Uterovaginal prolapse, unspecified: Secondary | ICD-10-CM

## 2023-03-13 NOTE — Progress Notes (Signed)
Chief Complaint  Patient presents with   Pessary Check    Blood pressure 132/81, pulse 62, weight 220 lb (99.8 kg).  Ana Coleman presents today for routine follow up related to her pessary.   She uses a Milex ring with support #4 She reports no vaginal discharge and resolved vaginal bleeding, was present when she was on aspirin peri operative to her knee surgery  Likert scale(1 not bothersome -5 very bothersome)  :  2  Exam reveals no undue vaginal mucosal pressure of breakdown, no discharge and no vaginal bleeding.  Vaginal Epithelial Abnormality Classification System:   0 0    No abnormalities 1    Epithelial erythema 2    Granulation tissue 3    Epithelial break or erosion, 1 cm or less 4    Epithelial break or erosion, 1 cm or greater  The pessary is removed, cleaned and replaced without difficulty.      ICD-10-CM   1. Pessary maintenance, milex ring with support #4, OF 12/22  Z46.89     2. POP-Q stage 2 cystocele  N81.10     3. Uterine prolapse, Grade 2  N81.4        [name] will be sen back in 3 months for continued follow up.  Lazaro Arms, MD  03/13/2023 3:39 PM

## 2023-05-23 ENCOUNTER — Other Ambulatory Visit: Payer: Self-pay

## 2023-05-23 ENCOUNTER — Telehealth: Payer: Self-pay

## 2023-05-23 DIAGNOSIS — N6320 Unspecified lump in the left breast, unspecified quadrant: Secondary | ICD-10-CM

## 2023-05-23 NOTE — Telephone Encounter (Signed)
 Patient, [employer], called and stated that she fell in January 2025 after having knee surgery, landed on her bathtub, and injured her left breast. Patient stated that she has a small lump in the left breast, redness around left nipple, feels slightly hard, feels like there may be some fluid inside. Patient denies any pain, nipple inversion, skin changes. Patient does not have a direct history of breast cancer, stated a cousin passed away due to breast cancer 1 year ago. Patient informed needs to be evaluated in [facility], transferred to scheduler.

## 2023-05-24 ENCOUNTER — Other Ambulatory Visit: Payer: Self-pay | Admitting: Obstetrics and Gynecology

## 2023-05-24 DIAGNOSIS — N6320 Unspecified lump in the left breast, unspecified quadrant: Secondary | ICD-10-CM

## 2023-06-12 ENCOUNTER — Ambulatory Visit: Payer: Self-pay | Admitting: Obstetrics & Gynecology

## 2023-06-21 ENCOUNTER — Encounter: Payer: Self-pay | Admitting: Obstetrics & Gynecology

## 2023-06-21 ENCOUNTER — Ambulatory Visit (INDEPENDENT_AMBULATORY_CARE_PROVIDER_SITE_OTHER): Payer: Self-pay | Admitting: Obstetrics & Gynecology

## 2023-06-21 VITALS — BP 138/75 | HR 59 | Ht 61.5 in | Wt 218.0 lb

## 2023-06-21 DIAGNOSIS — N8110 Cystocele, unspecified: Secondary | ICD-10-CM

## 2023-06-21 DIAGNOSIS — Z4689 Encounter for fitting and adjustment of other specified devices: Secondary | ICD-10-CM

## 2023-06-21 NOTE — Progress Notes (Signed)
 Chief Complaint  Patient presents with   Pessary Check    Blood pressure 138/75, pulse (!) 59, height 5' 1.5" (1.562 m), weight 218 lb (98.9 kg).  Ana Coleman presents today for routine follow up related to her pessary.   She uses a Milex ring with support #4 She reports no vaginal discharge and no vaginal bleeding   Likert scale(1 not bothersome -5 very bothersome)  :  1  Exam reveals no undue vaginal mucosal pressure of breakdown, no discharge and no vaginal bleeding.  Vaginal Epithelial Abnormality Classification System:   0 0    No abnormalities 1    Epithelial erythema 2    Granulation tissue 3    Epithelial break or erosion, 1 cm or less 4    Epithelial break or erosion, 1 cm or greater  The pessary is removed, cleaned and replaced without difficulty.      ICD-10-CM   1. Pessary maintenance, milex ring with support #4, OF 12/22  Z46.89     2. POP-Q stage 2 cystocele  N81.10        RACINE ERBY will be sen back in 4 months for continued follow up.  Wendelyn Halter, MD  06/21/2023 9:03 AM

## 2023-07-03 ENCOUNTER — Telehealth: Payer: Self-pay

## 2023-07-03 NOTE — Telephone Encounter (Signed)
 Attempted call for follow up with interpreter services of Care [facility] client.   Her next appointment with [facility] is scheduled for 08/16/23  Her last A1C is prediabetes at 5.9 on 05/17/23  No answer today, no message left.  Will mail education on Prediabetes and prevention of type 2 as well as a Care Connect welcome letter that has services we offer and contact numbers.   Kris Pester RN [employer]

## 2023-07-13 ENCOUNTER — Inpatient Hospital Stay: Payer: Self-pay | Attending: *Deleted

## 2023-07-13 ENCOUNTER — Telehealth: Payer: Self-pay

## 2023-07-13 NOTE — Telephone Encounter (Signed)
 I attempted to reach the pt. Her daughter Rayann Cage answered and advised the pt had an emergency and had to go out of town. I provided our office number for her to call to reschedule her appointment once she is ready.

## 2023-07-17 ENCOUNTER — Inpatient Hospital Stay (HOSPITAL_COMMUNITY): Admission: RE | Admit: 2023-07-17 | Payer: Self-pay | Source: Ambulatory Visit

## 2023-07-17 ENCOUNTER — Ambulatory Visit (HOSPITAL_COMMUNITY): Payer: Self-pay

## 2023-09-28 ENCOUNTER — Encounter: Payer: Self-pay | Admitting: Obstetrics & Gynecology

## 2023-09-28 ENCOUNTER — Ambulatory Visit (INDEPENDENT_AMBULATORY_CARE_PROVIDER_SITE_OTHER): Payer: Self-pay | Admitting: Obstetrics & Gynecology

## 2023-09-28 VITALS — BP 135/69 | HR 72 | Wt 218.0 lb

## 2023-09-28 DIAGNOSIS — N814 Uterovaginal prolapse, unspecified: Secondary | ICD-10-CM

## 2023-09-28 DIAGNOSIS — Z4689 Encounter for fitting and adjustment of other specified devices: Secondary | ICD-10-CM

## 2023-09-28 NOTE — Progress Notes (Signed)
 Chief Complaint  Patient presents with   Pessary Check    Blood pressure 135/69, pulse 72, weight 218 lb (98.9 kg).  Ana Coleman presents today for routine follow up related to her pessary.   She uses a Milex ring with support #4 She reports no vaginal discharge and no vaginal bleeding   Likert scale(1 not bothersome -5 very bothersome)  :  1  Exam reveals no undue vaginal mucosal pressure of breakdown, no discharge and no vaginal bleeding.  Vaginal Epithelial Abnormality Classification System:   0 0    No abnormalities 1    Epithelial erythema 2    Granulation tissue 3    Epithelial break or erosion, 1 cm or less 4    Epithelial break or erosion, 1 cm or greater  The pessary is removed, cleaned and replaced without difficulty.      ICD-10-CM   1. Pessary maintenance, milex ring with support #4, OF 12/22  Z46.89     2. Uterine prolapse, Grade 2  N81.4        Ana Coleman will be sen back in 4 months for continued follow up.  Vonn VEAR Inch, MD  09/28/2023 9:47 AM

## 2024-01-18 ENCOUNTER — Encounter: Payer: Self-pay | Admitting: Obstetrics & Gynecology

## 2024-01-18 ENCOUNTER — Ambulatory Visit (INDEPENDENT_AMBULATORY_CARE_PROVIDER_SITE_OTHER): Payer: Self-pay | Admitting: Obstetrics & Gynecology

## 2024-01-18 VITALS — BP 119/77 | Ht 64.0 in | Wt 222.0 lb

## 2024-01-18 DIAGNOSIS — Z4689 Encounter for fitting and adjustment of other specified devices: Secondary | ICD-10-CM

## 2024-01-18 DIAGNOSIS — N8110 Cystocele, unspecified: Secondary | ICD-10-CM

## 2024-01-18 DIAGNOSIS — N814 Uterovaginal prolapse, unspecified: Secondary | ICD-10-CM

## 2024-01-29 NOTE — Progress Notes (Signed)
 Follow up appointment for pessary/surgery discussion   Chief Complaint  Patient presents with   Pessary Check    Blood pressure 119/77, height 5' 4 (1.626 m), weight 222 lb (100.7 kg).  Patient would like to discontinue the use of the pessary We discussed surgical approach at length, along with daughter Recommended surgery is [facility] BSO anterior repair Discussed risks benfits in detail  All questions were answered  She will discuss with her other daughters and get back to our office when she has decided how to proceed  MEDS ordered this encounter: No orders of the defined types were placed in this encounter.   Orders for this encounter: No orders of the defined types were placed in this encounter.   Impression + Management Plan   ICD-10-CM   1. Pessary maintenance, milex ring with support #4, OF 12/22  Z46.89     2. Uterine prolapse, Grade 2  N81.4     3. POP-Q stage 2 cystocele  N81.10       Follow Up: No follow-ups on file.     All questions were answered.  Past Medical History:  Diagnosis Date   Ankle fracture 2016   R ankle   Anxiety    Bronchitis    History of prediabetes    Hyperlipidemia    Hypothyroidism     Past Surgical History:  Procedure Laterality Date   BIOPSY  09/15/2015   Procedure: BIOPSY;  Surgeon: Lamar CHRISTELLA Hollingshead, MD;  Location: [facility];  Service: Endoscopy;;  gastric & colon   CHOLECYSTECTOMY  01/30/1998   found a tumor at the same time   COLONOSCOPY N/A 09/15/2015   Procedure: COLONOSCOPY;  Surgeon: Lamar CHRISTELLA Hollingshead, MD;  Location: [facility];  Service: Endoscopy;  Laterality: N/A;  730 - interpreter scheduled do NOT move   ESOPHAGOGASTRODUODENOSCOPY N/A 09/15/2015   Procedure: ESOPHAGOGASTRODUODENOSCOPY (EGD);  Surgeon: Lamar CHRISTELLA Hollingshead, MD;  Location: [facility];  Service: Endoscopy;  Laterality: N/A;   INCISIONAL HERNIA REPAIR N/A 12/29/2016   Procedure: HERNIA REPAIR INCISIONAL;  Surgeon: Rubin Calamity, MD;  Location:  [facility];  Service: General;  Laterality: N/A;   ERAS PATHWAY   INSERTION OF MESH N/A 12/29/2016   Procedure: INSERTION OF MESH;  Surgeon: Rubin Calamity, MD;  Location: [facility];  Service: General;  Laterality: N/A;   MENISCUS REPAIR     TUBAL LIGATION      OB History     Gravida  3   Para  3   Term  3   Preterm      AB      Living  3      SAB      IAB      Ectopic      Multiple      Live Births  3           Allergies[1]  Social History   Socioeconomic History   Marital status: Married    Spouse name: Not on file   Number of children: 3   Years of education: Not on file   Highest education level: Not on file  Occupational History   Not on file  Tobacco Use   Smoking status: Never   Smokeless tobacco: Never  Vaping Use   Vaping status: Never Used  Substance and Sexual Activity   Alcohol use: No    Alcohol/week: 0.0 standard drinks of alcohol   Drug use: No   Sexual activity: Not Currently  Birth control/protection: Post-menopausal  Other Topics Concern   Not on file  Social History Narrative   Not on file   Social Drivers of Health   Tobacco Use: Low Risk (01/18/2024)   Patient History    Smoking Tobacco Use: Never    Smokeless Tobacco Use: Never    Passive Exposure: Not on file  Financial Resource Strain: Low Risk (01/03/2021)   Overall Financial Resource Strain (CARDIA)    Difficulty of Paying Living Expenses: Not hard at all  Food Insecurity: No Food Insecurity (01/03/2021)   Hunger Vital Sign    Worried About Running Out of Food in the Last Year: Never true    Ran Out of Food in the Last Year: Never true  Transportation Needs: No Transportation Needs (01/03/2021)   PRAPARE - Administrator, Civil Service (Medical): No    Lack of Transportation (Non-Medical): No  Physical Activity: Insufficiently Active (01/03/2021)   Exercise Vital Sign    Days of Exercise per Week: 3 days    Minutes of Exercise per Session: 10 min   Stress: No Stress Concern Present (01/03/2021)   [employer] of Occupational Health - Occupational Stress Questionnaire    Feeling of Stress : Not at all  Social Connections: Moderately Integrated (01/03/2021)   Social Connection and Isolation Panel    Frequency of Communication with Friends and Family: More than three times a week    Frequency of Social Gatherings with Friends and Family: Once a week    Attends Religious Services: More than 4 times per year    Active Member of Clubs or Organizations: No    Attends Banker Meetings: Never    Marital Status: Married  Depression (PHQ2-9): Low Risk (01/03/2021)   Depression (PHQ2-9)    PHQ-2 Score: 1  Alcohol Screen: Low Risk (01/03/2021)   Alcohol Screen    Last Alcohol Screening Score (AUDIT): 0  Housing: Low Risk (01/03/2021)   Housing    Last Housing Risk Score: 0  Utilities: Not on file  Health Literacy: Not on file    Family History  Problem Relation Age of Onset   Pancreatic cancer Maternal Grandmother    Hypertension Father    Skin cancer Mother    Hypertension Brother    Colon cancer Neg Hx    Liver disease Neg Hx        [1] No Known Allergies
